# Patient Record
Sex: Female | Born: 1980 | Hispanic: Yes | Marital: Married | Smoking: Never smoker
Health system: Southern US, Community
[De-identification: ages and names within clinical notes are randomized; demographics above are authoritative.]

## PROBLEM LIST (undated history)

## (undated) DIAGNOSIS — O99210 Obesity complicating pregnancy, unspecified trimester: Secondary | ICD-10-CM

## (undated) DIAGNOSIS — F419 Anxiety disorder, unspecified: Secondary | ICD-10-CM

## (undated) DIAGNOSIS — K219 Gastro-esophageal reflux disease without esophagitis: Secondary | ICD-10-CM

## (undated) HISTORY — PX: LAPAROSCOPIC GASTRIC SLEEVE RESECTION: SHX5895

## (undated) HISTORY — PX: APPENDECTOMY: SHX54

## (undated) HISTORY — PX: OTHER SURGICAL HISTORY: SHX169

---

## 2014-06-28 DIAGNOSIS — O99210 Obesity complicating pregnancy, unspecified trimester: Secondary | ICD-10-CM

## 2014-06-28 HISTORY — DX: Obesity complicating pregnancy, unspecified trimester: O99.210

## 2014-10-02 LAB — OB RESULTS CONSOLE GC/CHLAMYDIA
CHLAMYDIA, DNA PROBE: NEGATIVE
Gonorrhea: NEGATIVE

## 2014-10-11 LAB — OB RESULTS CONSOLE VARICELLA ZOSTER ANTIBODY, IGG: VARICELLA IGG: IMMUNE

## 2014-10-11 LAB — OB RESULTS CONSOLE HEPATITIS B SURFACE ANTIGEN: Hepatitis B Surface Ag: NEGATIVE

## 2014-10-11 LAB — OB RESULTS CONSOLE ABO/RH: RH Type: POSITIVE

## 2014-10-11 LAB — OB RESULTS CONSOLE HIV ANTIBODY (ROUTINE TESTING): HIV: NONREACTIVE

## 2014-10-11 LAB — OB RESULTS CONSOLE RUBELLA ANTIBODY, IGM: RUBELLA: IMMUNE

## 2014-11-12 ENCOUNTER — Other Ambulatory Visit: Payer: Self-pay | Admitting: Obstetrics and Gynecology

## 2014-11-12 DIAGNOSIS — O26879 Cervical shortening, unspecified trimester: Secondary | ICD-10-CM

## 2014-11-21 ENCOUNTER — Ambulatory Visit
Admission: RE | Admit: 2014-11-21 | Discharge: 2014-11-21 | Disposition: A | Discharge status: Home / Self Care | Payer: BLUE CROSS/BLUE SHIELD | Source: Ambulatory Visit | Attending: Maternal & Fetal Medicine | Admitting: Maternal & Fetal Medicine

## 2014-11-21 DIAGNOSIS — O26872 Cervical shortening, second trimester: Secondary | ICD-10-CM | POA: Insufficient documentation

## 2014-11-21 DIAGNOSIS — O26879 Cervical shortening, unspecified trimester: Secondary | ICD-10-CM

## 2014-11-21 LAB — US OB TRANSVAGINAL

## 2015-02-04 LAB — OB RESULTS CONSOLE RPR: RPR: NONREACTIVE

## 2015-03-17 ENCOUNTER — Other Ambulatory Visit: Payer: Self-pay | Admitting: Obstetrics and Gynecology

## 2015-03-17 DIAGNOSIS — O35EXX0 Maternal care for other (suspected) fetal abnormality and damage, fetal genitourinary anomalies, not applicable or unspecified: Secondary | ICD-10-CM

## 2015-03-17 DIAGNOSIS — O358XX0 Maternal care for other (suspected) fetal abnormality and damage, not applicable or unspecified: Secondary | ICD-10-CM

## 2015-03-27 ENCOUNTER — Ambulatory Visit
Admission: RE | Admit: 2015-03-27 | Discharge: 2015-03-27 | Disposition: A | Discharge status: Home / Self Care | Payer: BLUE CROSS/BLUE SHIELD | Source: Ambulatory Visit | Attending: Obstetrics and Gynecology | Admitting: Obstetrics and Gynecology

## 2015-03-27 ENCOUNTER — Ambulatory Visit (HOSPITAL_BASED_OUTPATIENT_CLINIC_OR_DEPARTMENT_OTHER)
Admission: RE | Admit: 2015-03-27 | Discharge: 2015-03-27 | Disposition: A | Discharge status: Home / Self Care | Payer: BLUE CROSS/BLUE SHIELD | Source: Ambulatory Visit | Attending: Obstetrics and Gynecology | Admitting: Obstetrics and Gynecology

## 2015-03-27 VITALS — BP 111/72 | HR 91 | Temp 98.3°F | Resp 18 | Ht 63.6 in | Wt 153.2 lb

## 2015-03-27 DIAGNOSIS — O358XX0 Maternal care for other (suspected) fetal abnormality and damage, not applicable or unspecified: Secondary | ICD-10-CM

## 2015-03-27 DIAGNOSIS — IMO0001 Reserved for inherently not codable concepts without codable children: Secondary | ICD-10-CM

## 2015-03-27 DIAGNOSIS — IMO0002 Reserved for concepts with insufficient information to code with codable children: Secondary | ICD-10-CM | POA: Insufficient documentation

## 2015-03-27 DIAGNOSIS — O35EXX0 Maternal care for other (suspected) fetal abnormality and damage, fetal genitourinary anomalies, not applicable or unspecified: Secondary | ICD-10-CM

## 2015-03-27 HISTORY — DX: Obesity complicating pregnancy, unspecified trimester: O99.210

## 2015-03-27 NOTE — Progress Notes (Addendum)
[facility] Consultation   Chief Complaint: renal anomaly on ultrasound   HPI: [name] is a 34 y.o. G2P1001 at 35 weeks sent by [facility] for fetal renal dilation  Past Medical History: Patient  has a past medical history of Obesity affecting pregnancy (2016).  Past Surgical History: She  has past surgical history that includes Cesarean section.  Obstetric History:  OB History    Gravida Para Term Preterm AB TAB SAB Ectopic Multiple Living   Gynecologic History:  Patient's last menstrual period was 07/15/2014.    Allergies: Patient is allergic to latex.  Social History: Patient  reports that she has never smoked. She has never used smokeless tobacco. She reports that she does not drink alcohol or use illicit drugs.  Family History: family history is not on file.  Review of Systems A full 12 point review of systems was negative or as noted in the History of Present Illness.  Physical Exam: BP 111/72 mmHg  Pulse 91  Temp(Src) 98.3 F (36.8 C) (Oral)  Resp 18  Ht 5' 3.6" (1.615 m)  Wt 153 lb 3.2 oz (69.491 kg)  BMI 26.64 kg/m2  SpO2 98%  LMP 07/15/2014 See u/s report   Asessement: 1. Fetal renal anomaly, not applicable or unspecified fetus   duplication of left renal collecting system , minimal dilation   Plan: Neonatal evaluation of kidneys  - pt and husband were reassured that overall health of the fetus appears good . They should share the information regarding the kidney with the pediatrician.    Total time spent with the patient was 15  minutes with greater than 50% spent in counseling and coordination of care. We appreciate this interesting consult and will be happy to be involved in the ongoing care of [name] in anyway her obstetricians desire.  Jimmey Ralph MD  Maternal-Fetal Medicine [facility]

## 2015-04-07 LAB — OB RESULTS CONSOLE GBS: GBS: NEGATIVE

## 2015-04-23 ENCOUNTER — Encounter
Admission: RE | Admit: 2015-04-23 | Discharge: 2015-04-23 | Disposition: A | Discharge status: Home / Self Care | Payer: BLUE CROSS/BLUE SHIELD | Source: Ambulatory Visit | Attending: Obstetrics and Gynecology | Admitting: Obstetrics and Gynecology

## 2015-04-23 VITALS — BP 119/76 | HR 72 | Ht 62.6 in | Wt 222.0 lb

## 2015-04-23 DIAGNOSIS — O358XX0 Maternal care for other (suspected) fetal abnormality and damage, not applicable or unspecified: Principal | ICD-10-CM

## 2015-04-23 DIAGNOSIS — IMO0001 Reserved for inherently not codable concepts without codable children: Secondary | ICD-10-CM

## 2015-04-23 HISTORY — DX: Gastro-esophageal reflux disease without esophagitis: K21.9

## 2015-04-23 HISTORY — DX: Anxiety disorder, unspecified: F41.9

## 2015-04-23 LAB — CBC
HCT: 42 % (ref 35.0–47.0)
Hemoglobin: 13.9 g/dL (ref 12.0–16.0)
MCH: 29.5 pg (ref 26.0–34.0)
MCHC: 32.9 g/dL (ref 32.0–36.0)
MCV: 89.5 fL (ref 80.0–100.0)
PLATELETS: 225 10*3/uL (ref 150–440)
RBC: 4.7 MIL/uL (ref 3.80–5.20)
RDW: 15.2 % — AB (ref 11.5–14.5)
WBC: 8.9 10*3/uL (ref 3.6–11.0)

## 2015-04-23 LAB — TYPE AND SCREEN
ABO/RH(D): AB POS
Antibody Screen: NEGATIVE
Extend sample reason: UNDETERMINED

## 2015-04-23 LAB — DIFFERENTIAL
Basophils Absolute: 0 10*3/uL (ref 0–0.1)
Basophils Relative: 0 %
Eosinophils Absolute: 0 10*3/uL (ref 0–0.7)
Eosinophils Relative: 0 %
LYMPHS PCT: 26 %
Lymphs Abs: 2.3 10*3/uL (ref 1.0–3.6)
MONO ABS: 0.7 10*3/uL (ref 0.2–0.9)
MONOS PCT: 8 %
NEUTROS ABS: 5.9 10*3/uL (ref 1.4–6.5)
Neutrophils Relative %: 66 %

## 2015-04-23 LAB — ABO/RH: ABO/RH(D): AB POS

## 2015-04-23 LAB — RAPID HIV SCREEN (HIV 1/2 AB+AG)
HIV 1/2 ANTIBODIES: NONREACTIVE
HIV-1 P24 ANTIGEN - HIV24: NONREACTIVE

## 2015-04-24 ENCOUNTER — Inpatient Hospital Stay: Payer: BLUE CROSS/BLUE SHIELD | Admitting: Anesthesiology

## 2015-04-24 ENCOUNTER — Inpatient Hospital Stay
Admission: RE | Admit: 2015-04-24 | Discharge: 2015-04-26 | DRG: 766 | Disposition: A | Discharge status: Home / Self Care | Payer: BLUE CROSS/BLUE SHIELD | Source: Ambulatory Visit | Attending: Obstetrics and Gynecology | Admitting: Obstetrics and Gynecology

## 2015-04-24 ENCOUNTER — Encounter
Admission: RE | Disposition: A | Discharge status: Home / Self Care | Payer: Self-pay | Source: Ambulatory Visit | Attending: Obstetrics and Gynecology

## 2015-04-24 DIAGNOSIS — Z302 Encounter for sterilization: Secondary | ICD-10-CM | POA: Diagnosis not present

## 2015-04-24 DIAGNOSIS — Z3A40 40 weeks gestation of pregnancy: Secondary | ICD-10-CM | POA: Diagnosis not present

## 2015-04-24 DIAGNOSIS — Z98891 History of uterine scar from previous surgery: Secondary | ICD-10-CM

## 2015-04-24 DIAGNOSIS — IMO0001 Reserved for inherently not codable concepts without codable children: Secondary | ICD-10-CM

## 2015-04-24 DIAGNOSIS — O34211 Maternal care for low transverse scar from previous cesarean delivery: Secondary | ICD-10-CM | POA: Diagnosis present

## 2015-04-24 DIAGNOSIS — E669 Obesity, unspecified: Secondary | ICD-10-CM

## 2015-04-24 DIAGNOSIS — O480 Post-term pregnancy: Principal | ICD-10-CM | POA: Diagnosis present

## 2015-04-24 DIAGNOSIS — N979 Female infertility, unspecified: Secondary | ICD-10-CM

## 2015-04-24 DIAGNOSIS — O358XX0 Maternal care for other (suspected) fetal abnormality and damage, not applicable or unspecified: Secondary | ICD-10-CM

## 2015-04-24 DIAGNOSIS — Z6830 Body mass index (BMI) 30.0-30.9, adult: Secondary | ICD-10-CM

## 2015-04-24 DIAGNOSIS — O0990 Supervision of high risk pregnancy, unspecified, unspecified trimester: Secondary | ICD-10-CM

## 2015-04-24 LAB — OB RESULTS CONSOLE GC/CHLAMYDIA
Chlamydia: NEGATIVE
Gonorrhea: NEGATIVE

## 2015-04-24 LAB — OB RESULTS CONSOLE HIV ANTIBODY (ROUTINE TESTING): HIV: NONREACTIVE

## 2015-04-24 LAB — RPR: RPR Ser Ql: NONREACTIVE

## 2015-04-24 LAB — OB RESULTS CONSOLE RPR: RPR: NONREACTIVE

## 2015-04-24 LAB — OB RESULTS CONSOLE GBS: GBS: NEGATIVE

## 2015-04-24 SURGERY — Surgical Case
Anesthesia: Spinal

## 2015-04-24 MED ORDER — PRENATAL MULTIVITAMIN CH
1.0000 | ORAL_TABLET | Freq: Every day | ORAL | Status: DC
  Administered 2015-04-25 – 2015-04-26 (×2): 1 via ORAL
  Filled 2015-04-24 (×2): qty 1

## 2015-04-24 MED ORDER — ONDANSETRON HCL 4 MG/2ML IJ SOLN
INTRAMUSCULAR | Status: DC | PRN
  Administered 2015-04-24: 4 mg via INTRAVENOUS

## 2015-04-24 MED ORDER — DIPHENHYDRAMINE HCL 50 MG/ML IJ SOLN
12.5000 mg | INTRAMUSCULAR | Status: DC | PRN
Start: 2015-04-24 — End: 2015-04-25

## 2015-04-24 MED ORDER — DIBUCAINE 1 % RE OINT: 1.0000 "application " | TOPICAL_OINTMENT | RECTAL | Status: DC | PRN

## 2015-04-24 MED ORDER — SODIUM CHLORIDE 0.9 % IJ SOLN
INTRAMUSCULAR | Status: AC
  Filled 2015-04-24: qty 10

## 2015-04-24 MED ORDER — IBUPROFEN 600 MG PO TABS
600.0000 mg | ORAL_TABLET | Freq: Four times a day (QID) | ORAL | Status: DC | PRN
  Administered 2015-04-25 – 2015-04-26 (×5): 600 mg via ORAL
  Filled 2015-04-24 (×5): qty 1

## 2015-04-24 MED ORDER — NALBUPHINE HCL 10 MG/ML IJ SOLN: 5.0000 mg | Freq: Once | INTRAMUSCULAR | Status: DC | PRN

## 2015-04-24 MED ORDER — OXYTOCIN 40 UNITS IN LACTATED RINGERS INFUSION - SIMPLE MED
INTRAVENOUS | Status: DC | PRN
  Administered 2015-04-24: 4 mL via INTRAVENOUS

## 2015-04-24 MED ORDER — LACTATED RINGERS IV SOLN
INTRAVENOUS | Status: DC
  Administered 2015-04-24 – 2015-04-25 (×3): via INTRAVENOUS

## 2015-04-24 MED ORDER — CEFAZOLIN SODIUM-DEXTROSE 2-3 GM-% IV SOLR
2.0000 g | INTRAVENOUS | Status: AC
  Administered 2015-04-24: 2 g via INTRAVENOUS
  Filled 2015-04-24: qty 50

## 2015-04-24 MED ORDER — MEPERIDINE HCL 25 MG/ML IJ SOLN: 6.2500 mg | INTRAMUSCULAR | Status: DC | PRN

## 2015-04-24 MED ORDER — SODIUM CHLORIDE 0.9 % IJ SOLN: 3.0000 mL | INTRAMUSCULAR | Status: DC | PRN

## 2015-04-24 MED ORDER — OXYTOCIN 40 UNITS IN LACTATED RINGERS INFUSION - SIMPLE MED: 62.5000 mL/h | INTRAVENOUS | Status: AC

## 2015-04-24 MED ORDER — BUPIVACAINE HCL (PF) 0.5 % IJ SOLN: 5.0000 mL | Freq: Once | INTRAMUSCULAR | Status: DC

## 2015-04-24 MED ORDER — MENTHOL 3 MG MT LOZG: 1.0000 | LOZENGE | OROMUCOSAL | Status: DC | PRN

## 2015-04-24 MED ORDER — BUPIVACAINE 0.25 % ON-Q PUMP DUAL CATH 400 ML: INJECTION | Status: DC

## 2015-04-24 MED ORDER — ONDANSETRON HCL 4 MG/2ML IJ SOLN: 4.0000 mg | Freq: Three times a day (TID) | INTRAMUSCULAR | Status: DC | PRN

## 2015-04-24 MED ORDER — WITCH HAZEL-GLYCERIN EX PADS: 1.0000 "application " | MEDICATED_PAD | CUTANEOUS | Status: DC | PRN

## 2015-04-24 MED ORDER — FENTANYL CITRATE (PF) 100 MCG/2ML IJ SOLN: 25.0000 ug | INTRAMUSCULAR | Status: DC | PRN

## 2015-04-24 MED ORDER — SENNOSIDES-DOCUSATE SODIUM 8.6-50 MG PO TABS
2.0000 | ORAL_TABLET | ORAL | Status: DC
  Administered 2015-04-25: 2 via ORAL
  Filled 2015-04-24: qty 2

## 2015-04-24 MED ORDER — DIPHENHYDRAMINE HCL 25 MG PO CAPS: 25.0000 mg | ORAL_CAPSULE | ORAL | Status: DC | PRN

## 2015-04-24 MED ORDER — NALBUPHINE HCL 10 MG/ML IJ SOLN
5.0000 mg | INTRAMUSCULAR | Status: DC | PRN
  Filled 2015-04-24: qty 0.5

## 2015-04-24 MED ORDER — SCOPOLAMINE 1 MG/3DAYS TD PT72: 1.0000 | MEDICATED_PATCH | Freq: Once | TRANSDERMAL | Status: DC

## 2015-04-24 MED ORDER — LACTATED RINGERS IV SOLN: INTRAVENOUS | Status: DC

## 2015-04-24 MED ORDER — LANOLIN HYDROUS EX OINT: 1.0000 "application " | TOPICAL_OINTMENT | CUTANEOUS | Status: DC | PRN

## 2015-04-24 MED ORDER — BUPIVACAINE HCL (PF) 0.5 % IJ SOLN
5.0000 mL | Freq: Once | INTRAMUSCULAR | Status: DC
  Filled 2015-04-24: qty 30

## 2015-04-24 MED ORDER — FERROUS SULFATE 325 (65 FE) MG PO TABS
325.0000 mg | ORAL_TABLET | Freq: Two times a day (BID) | ORAL | Status: DC
  Administered 2015-04-25: 325 mg via ORAL
  Filled 2015-04-24: qty 1

## 2015-04-24 MED ORDER — SIMETHICONE 80 MG PO CHEW
80.0000 mg | CHEWABLE_TABLET | Freq: Three times a day (TID) | ORAL | Status: DC
  Administered 2015-04-25 – 2015-04-26 (×5): 80 mg via ORAL
  Filled 2015-04-24 (×5): qty 1

## 2015-04-24 MED ORDER — ONDANSETRON HCL 4 MG/2ML IJ SOLN: 4.0000 mg | Freq: Once | INTRAMUSCULAR | Status: DC | PRN

## 2015-04-24 MED ORDER — NALOXONE HCL 0.4 MG/ML IJ SOLN: 0.4000 mg | INTRAMUSCULAR | Status: DC | PRN

## 2015-04-24 MED ORDER — NALBUPHINE HCL 10 MG/ML IJ SOLN: 5.0000 mg | INTRAMUSCULAR | Status: DC | PRN

## 2015-04-24 MED ORDER — BUPIVACAINE 0.25 % ON-Q PUMP DUAL CATH 400 ML
400.0000 mL | INJECTION | Status: DC
  Filled 2015-04-24: qty 400

## 2015-04-24 MED ORDER — BUPIVACAINE HCL (PF) 0.5 % IJ SOLN
INTRAMUSCULAR | Status: DC | PRN
Start: 2015-04-24 — End: 2015-04-24
  Administered 2015-04-24: 10 mL

## 2015-04-24 MED ORDER — BUPIVACAINE HCL (PF) 0.75 % IJ SOLN
INTRAMUSCULAR | Status: DC | PRN
  Administered 2015-04-24: 1.7 mL

## 2015-04-24 MED ORDER — NALOXONE HCL 2 MG/2ML IJ SOSY: 1.0000 ug/kg/h | PREFILLED_SYRINGE | INTRAVENOUS | Status: DC | PRN

## 2015-04-24 MED ORDER — MORPHINE SULFATE (PF) 0.5 MG/ML IJ SOLN
INTRAMUSCULAR | Status: DC | PRN
  Administered 2015-04-24: .2 mg via EPIDURAL

## 2015-04-24 MED ORDER — LACTATED RINGERS IV SOLN
INTRAVENOUS | Status: DC
  Administered 2015-04-24 (×2): via INTRAVENOUS

## 2015-04-24 MED ORDER — PHENYLEPHRINE HCL 10 MG/ML IJ SOLN
INTRAMUSCULAR | Status: DC | PRN
  Administered 2015-04-24: 100 ug via INTRAVENOUS
  Administered 2015-04-24: 50 ug via INTRAVENOUS

## 2015-04-24 MED ORDER — CITRIC ACID-SODIUM CITRATE 334-500 MG/5ML PO SOLN
30.0000 mL | ORAL | Status: AC
  Administered 2015-04-24: 30 mL via ORAL
  Filled 2015-04-24: qty 30

## 2015-04-24 MED ORDER — DIPHENHYDRAMINE HCL 25 MG PO CAPS: 25.0000 mg | ORAL_CAPSULE | Freq: Four times a day (QID) | ORAL | Status: DC | PRN

## 2015-04-24 SURGICAL SUPPLY — 29 items
CANISTER SUCT 3000ML (MISCELLANEOUS) ×3 IMPLANT
CATH KIT ON-Q SILVERSOAK 5IN (CATHETERS) ×6 IMPLANT
CHLORAPREP W/TINT 26ML (MISCELLANEOUS) ×6 IMPLANT
CLOSURE WOUND 1/2 X4 (GAUZE/BANDAGES/DRESSINGS) ×1
CUP MEDICINE 2OZ PLAST GRAD ST (MISCELLANEOUS) ×3 IMPLANT
DRSG TELFA 3X8 NADH (GAUZE/BANDAGES/DRESSINGS) ×3 IMPLANT
GAUZE SPONGE 4X4 12PLY STRL (GAUZE/BANDAGES/DRESSINGS) ×3 IMPLANT
GLOVE BIO SURGEON STRL SZ8 (GLOVE) ×18 IMPLANT
GLOVE BIOGEL PI IND STRL 7.5 (GLOVE) ×6 IMPLANT
GLOVE BIOGEL PI INDICATOR 7.5 (GLOVE) ×12
GLOVE SKINSENSE NS SZ7.0 (GLOVE) ×2
GLOVE SKINSENSE STRL SZ7.0 (GLOVE) ×1 IMPLANT
GOWN STRL REUS W/ TWL LRG LVL3 (GOWN DISPOSABLE) ×2 IMPLANT
GOWN STRL REUS W/TWL LRG LVL3 (GOWN DISPOSABLE) ×4
LIQUID BAND (GAUZE/BANDAGES/DRESSINGS) ×3 IMPLANT
NDL SAFETY 22GX1.5 (NEEDLE) ×3 IMPLANT
NS IRRIG 1000ML POUR BTL (IV SOLUTION) ×3 IMPLANT
PACK C SECTION AR (MISCELLANEOUS) ×3 IMPLANT
PAD GROUND ADULT SPLIT (MISCELLANEOUS) ×3 IMPLANT
PAD OB MATERNITY 4.3X12.25 (PERSONAL CARE ITEMS) ×6 IMPLANT
PAD PREP 24X41 OB/GYN DISP (PERSONAL CARE ITEMS) ×3 IMPLANT
STRIP CLOSURE SKIN 1/2X4 (GAUZE/BANDAGES/DRESSINGS) ×2 IMPLANT
SUT 2-0 PL GUT LIGAPAK (SUTURE) ×3 IMPLANT
SUT MAXON ABS #0 GS21 30IN (SUTURE) ×6 IMPLANT
SUT MNCRL AB 4-0 PS2 18 (SUTURE) ×3 IMPLANT
SUT VIC AB 0 CT1 36 (SUTURE) ×12 IMPLANT
SUT VIC AB 1 CT1 36 (SUTURE) IMPLANT
SWABSTK COMLB BENZOIN TINCTURE (MISCELLANEOUS) ×3 IMPLANT
SYRINGE 10CC LL (SYRINGE) ×3 IMPLANT

## 2015-04-24 NOTE — Progress Notes (Signed)
Pt arrived to PACU post c/s, assessed, temp at 96.1 axillary. MD and anesthesia notified.  Bear hugger applied and explained to pt with interpreter. Warm fluid in IV.

## 2015-04-24 NOTE — Transfer of Care (Signed)
Immediate Anesthesia Transfer of Care Note  Patient: Elizabeth Mcdowell  Procedure(s) Performed: Procedure(s): CESAREAN SECTION WITH BILATERAL TUBAL LIGATION (N/A)  Patient Location: PACU and Mother/Baby  Anesthesia Type:Spinal  Level of Consciousness: awake, alert  and oriented  Airway & Oxygen Therapy: Patient Spontanous Breathing  Post-op Assessment: Report given to RN and Post -op Vital signs reviewed and stable  Post vital signs: Reviewed and stable  Last Vitals:  Filed Vitals:   04/24/15 0941  BP: 112/70  Pulse: 60  Temp: 35.6 C  Resp: 22    Complications: No apparent anesthesia complications

## 2015-04-24 NOTE — Op Note (Signed)
Cesarean Section Procedure Note   Elizabeth Mcdowell   04/24/2015   Pre-operative Diagnosis: 1) history of prior cesarean delivery, desires repeat, 2) intrauterine gestation at 3739 weeks, 3) desires permanent sterility   Post-operative Diagnosis: 1) history of prior cesarean delivery, desires repeat, 2) intrauterine gestation at 5539 weeks, 3) desires permanent sterility  Procedure:  1) Repeat low-transverse cesarean section 2) bilateral tubal ligation  Surgeon: Surgeon(s) and Role:    * Conard NovakStephen D Jakiah Bienaime, MD - Primary    * Elenora Fenderhelsea C Ward, MD - Assisting   Anesthesia: spinal   Findings:  1) normal appearing gravid uterus, fallopian tubes, and ovaries 2) viable female infant   Estimated Blood Loss: 900 mL  Total IV Fluids: 1,500 ml   Urine Output: 150 mL clear urine at end of case  Specimens: portion of right and left fallopian tube for permanent  Complications: no complications  Disposition: PACU - hemodynamically stable.   Maternal Condition: stable   Baby condition / location:  Couplet care / Skin to Skin  Procedure Details:  The patient was seen in the Holding Room. The risks, benefits, complications, treatment options, and expected outcomes were discussed with the patient. The patient concurred with the proposed plan, giving informed consent. identified as Elizabeth Mcdowell and the procedure verified as C-Section Delivery. A Time Out was held and the above information confirmed.   After induction of anesthesia, the patient was draped and prepped in the usual sterile manner. A Pfannenstiel incision was made and carried down through the subcutaneous tissue to the fascia. Fascial incision was made and extended transversely. The fascia was separated from the underlying rectus tissue superiorly and inferiorly. The peritoneum was identified and entered. Peritoneal incision was extended longitudinally. There was a 3cm segment of omentum adherent to the anterior abdominal  wall that was doubly clamped with Kelly clamps and transected.  The free ends were suture ligated with 0 vicryl. The bladder flap was bluntly freed from the lower uterine segment. A low transverse uterine incision was made and the hysterotomy was extended with cranial-caudal tension. Delivered from cephalic presentation was a 2,890 gram Female with Apgar scores of 9 at one minute and 9 at five minutes. Cord ph was not sent the umbilical cord was clamped and cut cord blood was not obtained for evaluation. The placenta was removed Intact and appeared normal. The uterine outline, tubes and ovaries appeared normal}. The uterine incision was closed with running locked sutures of 0 Vicryl.  A second layer of the same suture was thrown in an imbricating fashion.  Hemostasis was assured.    The tubal ligation portion of the procedure was performed at this point.  The left fallopian tube was identified and followed out to the fimbriated end.  A Babcock clamp was used to grasp the tube in the mid-isthmic portion and two 2-0 plain gut sutures were used to ligate the tube.  An approximately 3cm segment of tube was removed with hemostasis assured.  The same procedure was performed on the right fallopian tube with hemostasis noted. (note the Pomeroy method was used on the left side and due to tube length the Parkland method was performed on the right side).  The uterus was retained to the abdomen and the paracolic gutters were cleared of all clots and debris.  The peritoneum and rectus muscles were reapproximated using 0-Vicryl using a horizontal mattress stitch.  The rectus muscles were inspected and found to be hemostatic.  The On-Q catheter pumps were  inserted in accordance with the manufacturer's recommendations.  The catheters were inserted approximately 4cm cephelad to the incision line, approximately 1cm apart, straddling the midline.  They were inserted to a depth of the 4th mark. They were positioned superficial to  the rectus abdominus muscles and deep to the rectus fascia.    The fascia was then reapproximated with running sutures of 1-0 PDS, looped. After lavage and assurance of hemostasis, the subcutaneous tissue was reapproximated using 2-0 plain gut such that no greater than 2cm of dead space remained. The subcuticular closure was performed using 4-0 monocryl. The skin closure was reinforced using surgical skin glue.   The On-Q catheters were bolused with 5 mL of 0.5% marcaine plain for a total of 10 mL.  The catheters were affixed to the skin with surgical skin glue, steri-strips, and tegaderm.    Instrument, sponge, and needle counts were correct prior the abdominal closure and were correct at the conclusion of the case.  The patient received Ancef 2 gram IV prior to skin incision (within 30 minutes). For VTE prophylaxis she was wearing SCDs throughout the case.   Signed: Conard Novak, MD 04/24/2015 9:30 AM

## 2015-04-24 NOTE — Anesthesia Preprocedure Evaluation (Signed)
Anesthesia Evaluation  Patient identified by MRN, date of birth, ID band Patient awake    Reviewed: Allergy & Precautions, NPO status , Patient's Chart, lab work & pertinent test results  Airway Mallampati: II       Dental no notable dental hx.    Pulmonary neg pulmonary ROS,    Pulmonary exam normal        Cardiovascular negative cardio ROS Normal cardiovascular exam     Neuro/Psych Anxiety negative neurological ROS     GI/Hepatic Neg liver ROS, GERD  ,  Endo/Other  negative endocrine ROSMorbid obesity  Renal/GU negative Renal ROS     Musculoskeletal negative musculoskeletal ROS (+)   Abdominal (+) + obese,   Peds negative pediatric ROS (+)  Hematology negative hematology ROS (+)   Anesthesia Other Findings   Reproductive/Obstetrics negative OB ROS                             Anesthesia Physical Anesthesia Plan  ASA: II  Anesthesia Plan: Spinal   Post-op Pain Management:    Induction:   Airway Management Planned: Simple Face Mask  Additional Equipment:   Intra-op Plan:   Post-operative Plan:   Informed Consent: I have reviewed the patients History and Physical, chart, labs and discussed the procedure including the risks, benefits and alternatives for the proposed anesthesia with the patient or authorized representative who has indicated his/her understanding and acceptance.     Plan Discussed with: CRNA  Anesthesia Plan Comments:         Anesthesia Quick Evaluation

## 2015-04-24 NOTE — Lactation Note (Signed)
This note was copied from the chart of Elizabeth Mcdowell. Lactation Consultation Note  Patient Name: Elizabeth Mcdowell [id number]'s Date: 04/24/2015 Reason for consult: Initial assessment  A medical interpreter was present to translate breast feeding basics and to interpret questions from parents.  Maternal Data Has patient been taught Hand Expression?: Yes Does the patient have breastfeeding experience prior to this delivery?: Yes  Feeding Feeding Type: Breast Fed Length of feed: 20 min  LATCH Score/Interventions Latch: Repeated attempts needed to sustain latch, nipple held in mouth throughout feeding, stimulation needed to elicit sucking reflex.  Audible Swallowing: A few with stimulation Intervention(s): Hand expression;Skin to skin  Type of Nipple: Everted at rest and after stimulation  Comfort (Breast/Nipple): Soft / non-tender     Hold (Positioning): No assistance needed to correctly position infant at breast.  LATCH Score: 8  Lactation Tools Discussed/Used  none   Consult Status   Continue while in patient   Trudee GripCarolyn P Akshaj Besancon 04/24/2015, 3:29 PM

## 2015-04-24 NOTE — H&P (Signed)
History and Physical Interval Note:  Elizabeth Mcdowell  has presented today for surgery, with the diagnosis of PRIOR CSECTION  The various methods of treatment have been discussed with the patient and family. After consideration of risks, benefits and other options for treatment, the patient has consented to  Procedure(s): CESAREAN SECTION WITH BILATERAL TUBAL LIGATION (N/A) as a surgical intervention .  The patient's history has been reviewed, patient examined, no change in status, stable for surgery.  I have reviewed the patient's chart and labs.  Questions were answered to the patient's satisfaction.    The patient does not take a beta blocker and one is not indicated for this surgery.  Conard NovakJackson, Kennedy Bohanon D, MD 04/24/2015 7:30 AM

## 2015-04-24 NOTE — Anesthesia Procedure Notes (Signed)
Spinal Patient location during procedure: OR Start time: 04/24/2015 7:31 AM End time: 04/24/2015 7:48 AM Reason for block: at surgeon's request Staffing Anesthesiologist: Elijio MilesVAN STAVEREN, GIJSBERTUS F Performed by: anesthesiologist  Preanesthetic Checklist Completed: patient identified, site marked, surgical consent, pre-op evaluation, timeout performed, IV checked, risks and benefits discussed, monitors and equipment checked and at surgeon's request Spinal Block Patient position: sitting Prep: Betadine Patient monitoring: heart rate and blood pressure Approach: midline Location: L3-4 Injection technique: single-shot Needle Needle type: Quincke  Needle gauge: 25 G Needle length: 9 cm Needle insertion depth: 6 cm Assessment Sensory level: T6

## 2015-04-24 NOTE — Lactation Note (Signed)
This note was copied from the chart of Elizabeth Mcdowell. Lactation Consultation Note  Patient Name: Elizabeth Mcdowell [id number]'s Date: 04/24/2015 Reason for consult: Follow-up assessment   Maternal Data  BAsic breast feeding reviewed with aid of medical interpreter.  Feeding Feeding Type: Breast Fed  LATCH Score/Interventions Latch: Too sleepy or reluctant, no latch achieved, no sucking elicited. Intervention(s): Adjust position;Assist with latch  Audible Swallowing: None Intervention(s): Skin to skin;Hand expression Intervention(s): Hand expression  Type of Nipple: Everted at rest and after stimulation  Comfort (Breast/Nipple): Soft / non-tender     Hold (Positioning): Assistance needed to correctly position infant at breast and maintain latch.  LATCH Score: 5  Lactation Tools Discussed/Used     Consult Status      Trudee GripCarolyn P Anaja Monts 04/24/2015, 4:56 PM

## 2015-04-25 LAB — CBC
HCT: 39.5 % (ref 35.0–47.0)
Hemoglobin: 12.9 g/dL (ref 12.0–16.0)
MCH: 29.5 pg (ref 26.0–34.0)
MCHC: 32.7 g/dL (ref 32.0–36.0)
MCV: 90.3 fL (ref 80.0–100.0)
PLATELETS: 185 10*3/uL (ref 150–440)
RBC: 4.37 MIL/uL (ref 3.80–5.20)
RDW: 15.4 % — AB (ref 11.5–14.5)
WBC: 10.1 10*3/uL (ref 3.6–11.0)

## 2015-04-25 LAB — SURGICAL PATHOLOGY

## 2015-04-25 MED ORDER — FERROUS SULFATE 325 (65 FE) MG PO TABS
325.0000 mg | ORAL_TABLET | Freq: Every day | ORAL | Status: DC
Start: 2015-04-26 — End: 2015-04-26
  Administered 2015-04-26: 325 mg via ORAL
  Filled 2015-04-25: qty 1

## 2015-04-25 MED ORDER — OXYCODONE HCL 5 MG PO TABS
5.0000 mg | ORAL_TABLET | Freq: Four times a day (QID) | ORAL | Status: DC | PRN
  Administered 2015-04-26: 5 mg via ORAL
  Filled 2015-04-25: qty 1

## 2015-04-25 MED ORDER — DOCUSATE SODIUM 100 MG PO CAPS
100.0000 mg | ORAL_CAPSULE | Freq: Two times a day (BID) | ORAL | Status: DC
  Administered 2015-04-26: 100 mg via ORAL
  Filled 2015-04-25: qty 1

## 2015-04-25 NOTE — Progress Notes (Signed)
Daily Post Partum Note  Elizabeth Mcdowell is a 34 y.o. [id number]  POD#1 s/p rpt c-section and BTL @ 3767w3d.  Pregnancy c/b BMI 30  24hr/overnight events:  none  Subjective:  +flatus, negative ROS. Pt doing well  Objective:    Current Vital Signs 24h Vital Sign Ranges  T 98 F (36.7 C) Temp  Avg: 98.2 F (36.8 C)  Min: 97.8 F (36.6 C)  Max: 98.8 F (37.1 C)  BP 113/61 mmHg BP  Min: 100/68  Max: 138/73  HR 83 Pulse  Avg: 71.7  Min: 56  Max: 92  RR 18 Resp  Avg: 18.8  Min: 16  Max: 23  SaO2 98 % Not Delivered SpO2  Avg: 98.8 %  Min: 98 %  Max: 100 %       24 Hour I/O Current Shift I/O  Time Ins Outs 10/27 0701 - 10/28 0700 In: 7015.7 [P.O.:1680; I.V.:5335.7] Out: 6475 [Urine:5575] 10/28 0701 - 10/28 1900 In: 685 [P.O.:480; I.V.:205] Out: 450 [Urine:450]    General: NAD Abdomen: obese, +BS, soft, NTTP, ND, on q in place and dressing c/d/i Perineum: deferred Skin:  Warm and dry.  Cardiovascular:Regular rate and rhythm. Respiratory:  Clear to auscultation bilateral. Normal respiratory effort Extremities: no c/c/e  Medications Current Facility-Administered Medications  Medication Dose Route Frequency Provider Last Rate Last Dose  . bupivacaine 0.25 % ON-Q pump DUAL CATH 400 mL   Other Continuous Conard NovakStephen D Jackson, MD      . witch hazel-glycerin (TUCKS) pad 1 application  1 application Topical PRN Conard NovakStephen D Jackson, MD       And  . dibucaine (NUPERCAINAL) 1 % rectal ointment 1 application  1 application Rectal PRN Conard NovakStephen D Jackson, MD      . diphenhydrAMINE (BENADRYL) capsule 25 mg  25 mg Oral Q6H PRN Conard NovakStephen D Jackson, MD      . diphenhydrAMINE (BENADRYL) injection 12.5 mg  12.5 mg Intravenous Q4H PRN Gijsbertus Georgana CurioF Van Staveren, MD      . fentaNYL (SUBLIMAZE) injection 25 mcg  25 mcg Intravenous Q5 min PRN Gijsbertus Georgana CurioF Van Staveren, MD      . ferrous sulfate tablet 325 mg  325 mg Oral BID WC Conard NovakStephen D Jackson, MD   325 mg at 04/25/15 0930  . ibuprofen (ADVIL,MOTRIN)  tablet 600 mg  600 mg Oral Q6H PRN Gijsbertus Georgana CurioF Van Staveren, MD   600 mg at 04/25/15 0601  . lactated ringers infusion   Intravenous Continuous Conard NovakStephen D Jackson, MD 125 mL/hr at 04/25/15 0615    . lanolin ointment 1 application  1 application Topical PRN Conard NovakStephen D Jackson, MD      . menthol-cetylpyridinium (CEPACOL) lozenge 3 mg  1 lozenge Oral Q2H PRN Conard NovakStephen D Jackson, MD      . meperidine (DEMEROL) injection 6.25 mg  6.25 mg Intravenous Q5 min PRN Gijsbertus F Darleene CleaverVan Staveren, MD      . nalbuphine (NUBAIN) injection 5 mg  5 mg Intravenous Q4H PRN Gijsbertus Georgana CurioF Van Staveren, MD       Or  . nalbuphine (NUBAIN) injection 5 mg  5 mg Subcutaneous Q4H PRN Gijsbertus F Darleene CleaverVan Staveren, MD      . nalbuphine (NUBAIN) injection 5 mg  5 mg Intravenous Once PRN Gijsbertus Georgana CurioF Van Staveren, MD       Or  . nalbuphine (NUBAIN) injection 5 mg  5 mg Subcutaneous Once PRN Gijsbertus F Darleene CleaverVan Staveren, MD      . naloxone [facility]NARCAN) 2 mg  in dextrose 5 % 250 mL infusion  1-4 mcg/kg/hr Intravenous Continuous PRN Gijsbertus F Darleene Cleaver, MD      . naloxone [facility]) injection 0.4 mg  0.4 mg Intravenous PRN Gijsbertus Georgana Curio, MD       And  . sodium chloride 0.9 % injection 3 mL  3 mL Intravenous PRN Gijsbertus F Darleene Cleaver, MD      . ondansetron [facility]) injection 4 mg  4 mg Intravenous Q8H PRN Gijsbertus F Darleene Cleaver, MD      . ondansetron [facility]) injection 4 mg  4 mg Intravenous Once PRN Gijsbertus Georgana Curio, MD      . prenatal multivitamin tablet 1 tablet  1 tablet Oral Q1200 Conard Novak, MD   1 tablet at 04/25/15 0930  . scopolamine (TRANSDERM-SCOP) 1 MG/3DAYS 1.5 mg  1 patch Transdermal Once Gijsbertus F Darleene Cleaver, MD      . senna-docusate (Senokot-S) tablet 2 tablet  2 tablet Oral Q24H Conard Novak, MD   2 tablet at 04/25/15 0114  . simethicone (MYLICON) chewable tablet 80 mg  80 mg Oral TID PC Conard Novak, MD   80 mg at 04/25/15 0930    Labs:   Recent Labs Lab 04/23/15 0950  04/25/15 0500  WBC 8.9 10.1  HGB 13.9 12.9  HCT 42.0 39.5  PLT 225 185   No results for input(s): NA, K, CL, CO2, BUN, CREATININE, LABGLOM, GLUCOSE, CALCIUM in the last 168 hours.  Assessment & Plan:  Pt doing well *Postpartum/postop: routine care *Dispo: POD2-3   Cornelia Copa MD [facility] Pager [phone]

## 2015-04-25 NOTE — Anesthesia Postprocedure Evaluation (Signed)
  Anesthesia Post-op Note  Patient: Elizabeth Mcdowell  Procedure(s) Performed: Procedure(s): CESAREAN SECTION WITH BILATERAL TUBAL LIGATION (N/A)  Anesthesia type:Spinal  Patient location: Floor  Post pain: Pain level controlled  Post assessment: Post-op Vital signs reviewed, Patient's Cardiovascular Status Stable, Respiratory Function Stable, Patent Airway and No signs of Nausea or vomiting  Post vital signs: Reviewed and stable  Last Vitals:  Filed Vitals:   04/25/15 0554  BP: 127/73  Pulse: 87  Temp:   Resp: 20    Level of consciousness: awake, alert  and patient cooperative  Complications: No apparent anesthesia complications

## 2015-04-25 NOTE — Lactation Note (Signed)
This note was copied from the chart of Elizabeth Mcdowell. Lactation Consultation Note  Patient Name: Elizabeth Mcdowell ONGEX'BToday's Date: 04/25/2015 Reason for consult: Follow-up assessment   Maternal Data Does the patient have breastfeeding experience prior to this delivery?: No Mom brought her own nipple shield, baby able to latch without shield, I told her that she would not need a nipple shield, through spanish interpreter, instructed to call insurance to obtain a personal breast pump for use at home.   Feeding Feeding Type: Breast Milk Length of feed: 25 min (both breasts )  LATCH Score/Interventions Latch: Grasps breast easily, tongue down, lips flanged, rhythmical sucking. Intervention(s): Adjust position;Assist with latch;Breast compression  Audible Swallowing: A few with stimulation Intervention(s): Hand expression  Type of Nipple: Everted at rest and after stimulation  Comfort (Breast/Nipple): Soft / non-tender     Hold (Positioning): Assistance needed to correctly position infant at breast and maintain latch. Intervention(s): Breastfeeding basics reviewed;Support Pillows;Position options  LATCH Score: 8  Lactation Tools Discussed/Used Tools:  (pt had her own nipple shield, informed that she does not nee) WIC Program: No   Consult Status      Dyann KiefMarsha D Jennafer Gladue 04/25/2015, 5:08 PM

## 2015-04-25 NOTE — Anesthesia Post-op Follow-up Note (Signed)
  Anesthesia Pain Follow-up Note  Patient: Elizabeth Mcdowell  Day #: 1  Date of Follow-up: 04/25/2015 Time: 7:18 AM  Last Vitals:  Filed Vitals:   04/25/15 0554  BP: 127/73  Pulse: 87  Temp:   Resp: 20    Level of Consciousness: alert  Pain: mild   Side Effects:None  Catheter Site Exam: site not evaluated  Plan: D/C from anesthesia care  Clydene PughBeane, Zayana Salvador D

## 2015-04-26 MED ORDER — OXYCODONE-ACETAMINOPHEN 5-325 MG PO TABS: 1.0000 | ORAL_TABLET | ORAL | Status: AC | PRN

## 2015-04-26 NOTE — Progress Notes (Addendum)
Patient discharged to home in stable condtion. Patient aware to follow up with OB doctor in 1 week. Patient educated on when to return for any change in condition.

## 2015-04-26 NOTE — Discharge Instructions (Signed)
Parto por cesrea - Cuidados posteriores  (Cesarean Delivery, Care After) Siga estas instrucciones durante las prximas semanas. Estas indicaciones le proporcionan informacin general acerca de cmo deber cuidarse despus del procedimiento. El mdico tambin podr darle instrucciones ms especficas. El tratamiento se ha planificado de acuerdo a las prcticas mdicas actuales, pero a veces se producen problemas. Comunquese con el mdico si tiene algn problema o tiene dudas cuando vuelva a su casa.  INSTRUCCIONES PARA EL CUIDADO EN EL HOGAR  Tome slo medicamentos de venta libre o recetados, segn las indicaciones del mdico.  No beba alcohol, especialmente si est amamantando o toma analgsicos.  Nomastique tabaco ni fume.  Contine con un adecuado cuidado perineal. El buen cuidado perineal incluye:  Higienizarse de adelante hacia atrs.  Mantener la zona perineal limpia.  Controlar diariamente el corte (incisin) y observar si aumenta el enrojecimiento, si supura, se hincha o se separa la piel.  Limpie la incisin suavemente con jabn y agua todos los das, y luego squela dando golpecitos. Si el mdico la autoriza, deje la incisin al descubierto. Use un apsito (vendaje) si drena lquido o la incisin parece irritada. Si las pequeas tiras [facility] que cruzan la incisin no se caen dentro de los 7 das, retrelas suavemente.  Abrace una almohada al toser o estornudar hasta que la incisin se cure. Esto ayuda a Best boy.  No conduzca vehculos ni opere maquinarias hasta que el mdico la autorice.  Dchese, lvese el cabello y tome baos de inmersin segn las indicaciones de su mdico.  Utilice un sostn que le ajuste bien y que brinde buen soporte a sus Glass blower/designer.  Limite el uso de bombachas de sostn o medias panty.  Beba suficiente lquido para Consulting civil engineer orina clara o de color amarillo plido.  Consuma todos los das alimentos ricos en fibra como cereales y panes  [place], arroz, frijoles y frutas frescas y verduras. Estos alimentos pueden ayudarla a prevenir o Cytogeneticist.  Reanude las actividades como subir escaleras, conducir automviles, levantar objetos pesados, hacer ejercicios o viajar cuando le indique su mdico.  Hable con su mdico acerca de reanudar la actividad sexual. Volver a la actividad sexual depende del riesgo de infeccin, la velocidad de la curacin y la comodidad y su deseo de Financial controller.  Trate de que alguien la ayude con las actividades del hogar y con el recin nacido al menos durante algunos das despus de salir del hospital.  Descanse todo lo que pueda. Trate de descansar o tomar una siesta mientras el beb est durmiendo.  Aumente sus actividades gradualmente.  Cumpla con todos los controles programados para despus del [place]. Es muy importante asistir a todas las visitas de Nurse, adult. En estas visitas, su mdico va a controlarla para asegurarse de que est sanando fsica y emocionalmente. SOLICITE ATENCIN MDICA SI:   Elimina cogulos grandes por la vagina. Guarde algunos cogulos para mostrarle al mdico.  Tiene una secrecin con feo olor que proviene de la vagina.  Tiene dificultad para orinar.  Orina con frecuencia.  Siente dolor al [employer].  Nota un cambio en sus movimientos intestinales.  Aumenta el enrojecimiento, el dolor o la hinchazn en la zona de la incisin.  Observa que supura pus en la incisin.  La incisin se abre.  Sus [employer] duelen, estn duras o enrojecidas.  Sufre un dolor intenso de [place].  Tiene visin borrosa o ve manchas.  Se siente triste o deprimida.  Tiene pensamientos acerca de lastimarse o daar al  recién nacido. °· Tiene preguntas acerca de su cuidado, la atención del recién nacido o acerca de los medicamentos. °· Se siente mareada o sufre un desmayo. °· Tiene una erupción. °· Siente dolor u observa enrojecimiento o hinchazón en el sitio en que  estaba la vía intravenosa (IV). °· Tiene náuseas o vómitos. °· Usted dejó de amamantar al bebé y no ha tenido su período menstrual dentro de las 12 semanas siguientes. °· No amamanta al bebé y no tuvo su período menstrual en las últimas 12 semanas. °· Tiene fiebre. °SOLICITE ATENCIÓN MÉDICA DE INMEDIATO SI:  °· Siente dolor persistente. °· Siente dolor en el pecho. °· Le falta el aire. °· Se desmaya. °· Siente dolor en la pierna. °· Siente dolor en el estómago. °· El sangrado vaginal satura dos o más apósitos en 1 hora. °ASEGÚRESE DE QUE:  °· Comprende estas instrucciones. °· Controlará su enfermedad. °· Recibirá ayuda de inmediato si no mejora o si empeora. °  °Esta información no tiene como fin reemplazar el consejo del médico. Asegúrese de hacerle al médico cualquier pregunta que tenga. °  °Document Released: 06/14/2005 Document Revised: 07/05/2014 °Elsevier Interactive Patient Education ©2016 Elsevier Inc. ° °

## 2015-04-26 NOTE — Discharge Summary (Signed)
Obstetrical Discharge Summary  Date of Admission: 04/24/2015 Date of Discharge: @dischargedt @  Discharge Diagnosis: Term Pregnancy-delivered Primary OB:  [facility]   Gestational Age at Delivery: [id number]  Antepartum complications: none Date of Delivery: 04/24/15  Delivered By: Jean RosenthalJackson Delivery Type: repeat cesarean section, low transverse incision and BTL Intrapartum complications/course: None Anesthesia: spinal Placenta: manual removal Laceration: n/a Episiotomy: none Live born female  Birth Weight: 6 lb 5.9 oz (2890 g) APGAR: 9, 9   Post partum course: Since the delivery, patient has tolerate activity, diet, and daily functions without difficulty or complication.  Min lochia.  No breast concerns at this time.  No signs of depression currently.   Postpartum Exam:General appearance: alert, cooperative and no distress GI: soft, non-tender; bowel sounds normal; no masses,  no organomegaly Extremities: extremities normal, atraumatic, no cyanosis or edema  Disposition: home with infant Rh Immune globulin given: no Rubella vaccine given: no Varicella vaccine given: no Tdap vaccine given in AP or PP setting: given during prenatal care Flu vaccine given in AP or PP setting: given during prenatal care Contraception: bilateral tubal ligation  Prenatal Labs: AB POS//Rubella Immune//RPR negative//HIV negative/HepB Surface Ag negative//plans to breastfeed  Plan:  Elizabeth Mcdowell was discharged to home in good condition. [facility]  Discharge Medications:   Medication List    TAKE these medications        calcium carbonate 750 MG chewable tablet  Commonly known as:  TUMS EX  Chew 2 tablets by mouth 2 (two) times daily.     multivitamin-prenatal 27-0.8 MG Tabs tablet  Take 1 tablet by mouth daily at 12 noon.     oxyCODONE-acetaminophen 5-325 MG tablet  Commonly known as:  PERCOCET  Take 1 tablet by mouth every 4 (four) hours as needed  for moderate pain or severe pain.     ranitidine 150 MG tablet  Commonly known as:  ZANTAC  Take 150 mg by mouth 2 (two) times daily.        [facility]:  [facility]

## 2015-11-24 ENCOUNTER — Emergency Department
Admission: EM | Admit: 2015-11-24 | Discharge: 2015-11-24 | Disposition: A | Discharge status: Home / Self Care | Payer: BLUE CROSS/BLUE SHIELD | Attending: Emergency Medicine | Admitting: Emergency Medicine

## 2015-11-24 ENCOUNTER — Encounter: Payer: Self-pay | Admitting: Emergency Medicine

## 2015-11-24 DIAGNOSIS — Z91040 Latex allergy status: Secondary | ICD-10-CM | POA: Insufficient documentation

## 2015-11-24 DIAGNOSIS — K5900 Constipation, unspecified: Secondary | ICD-10-CM

## 2015-11-24 DIAGNOSIS — Z79899 Other long term (current) drug therapy: Secondary | ICD-10-CM | POA: Diagnosis not present

## 2015-11-24 MED ORDER — POLYETHYLENE GLYCOL 3350 17 G PO PACK: 17.0000 g | PACK | Freq: Every day | ORAL | Status: AC | PRN

## 2015-11-24 MED ORDER — MAGNESIUM CITRATE PO SOLN
0.5000 | Freq: Once | ORAL | Status: AC
  Administered 2015-11-24: 0.5 via ORAL
  Filled 2015-11-24 (×2): qty 296

## 2015-11-24 MED ORDER — MAGNESIUM CITRATE PO SOLN: 1.0000 | Freq: Once | ORAL | Status: AC | PRN

## 2015-11-24 MED ORDER — DOCUSATE SODIUM 100 MG PO CAPS
100.0000 mg | ORAL_CAPSULE | Freq: Once | ORAL | Status: AC
  Administered 2015-11-24: 100 mg via ORAL
  Filled 2015-11-24: qty 1

## 2015-11-24 NOTE — ED Notes (Signed)
Patient presents to the ED with constipation since Thursday.  Patient states her last normal bowel movement was Thursday.  Patient has taken milk of magnesia that helped a little but patient still feels impacted.  Patient has used an enema recently without result.

## 2015-11-24 NOTE — ED Provider Notes (Signed)
[facility] Emergency Department Provider Note   ____________________________________________  Time seen: Approximately 350 PM  I have reviewed the triage vital signs and the nursing notes.   HISTORY  Chief Complaint Constipation  Interpreter services, Asaias, utilized for translation HPI Elizabeth Mcdowell is a 35 y.o. female with a recent history of a gastric sleeve procedure was presenting to the emergency department with constipation over the past 5 days. She says that this past Friday she had her diet changed from just liquid to shakes as well as some solids. She says ever since then she has been having a very hard time moving her bowels. She says that she actually pulled open her rectum to release some stool but still feels stool in her rectum and is very uncomfortable. She denies any abdominal pain, nausea or vomiting.She said that she also used a Fleet enema which provided some relief and help with moving her bowels.   Past Medical History  Diagnosis Date  . Obesity affecting pregnancy 2016  . Anxiety   . GERD (gastroesophageal reflux disease)     Patient Active Problem List   Diagnosis Date Noted  . Supervision of high-risk pregnancy 04/24/2015  . H/O cesarean section 04/24/2015  . 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 Obesity 04/24/2015  . BMI 30.0-30.9,adult 04/24/2015  . S/P cesarean section 04/24/2015  . Fetal renal anomaly 03/27/2015    Past Surgical History  Procedure Laterality Date  . Cesarean section    . Cyst on ovary    . Appendectomy    . Cesarean section with bilateral tubal ligation N/A 04/24/2015    Procedure: CESAREAN SECTION WITH BILATERAL TUBAL LIGATION;  Surgeon: Conard NovakStephen D Jackson, MD;  Location: [facility] ORS;  Service: Obstetrics;  Laterality: N/A;    Current Outpatient Rx  Name  Route  Sig  Dispense  Refill  . calcium carbonate (TUMS EX) 750 MG chewable tablet   Oral   Chew 2 tablets by mouth 2 (two) times daily.         Marland Kitchen. oxyCODONE-acetaminophen (PERCOCET) 5-325 MG tablet   Oral   Take 1 tablet by mouth every 4 (four) hours as needed for moderate pain or severe pain.   40 tablet   0   . Prenatal Vit-Fe Fumarate-FA (MULTIVITAMIN-PRENATAL) 27-0.8 MG TABS tablet   Oral   Take 1 tablet by mouth daily at 12 noon.         . ranitidine (ZANTAC) 150 MG tablet   Oral   Take 150 mg by mouth 2 (two) times daily.           Allergies Latex  No family history on file.  Social History Social History  Substance Use Topics  . Smoking status: Never Smoker   . Smokeless tobacco: Never Used  . Alcohol Use: No    Review of Systems Constitutional: No fever/chills Eyes: No visual changes. ENT: No sore throat. Cardiovascular: Denies chest pain. Respiratory: Denies shortness of breath. Gastrointestinal: No abdominal pain.  No nausea, no vomiting.  No diarrhea.   Genitourinary: Negative for dysuria. Musculoskeletal: Negative for back pain. Skin: Negative for rash. Neurological: Negative for headaches, focal weakness or numbness.  10-point ROS otherwise negative.  ____________________________________________   PHYSICAL EXAM:  VITAL SIGNS: ED Triage Vitals  Enc Vitals Group     BP 11/24/15 1532 110/75 mmHg     Pulse Rate 11/24/15 1532 71     Resp 11/24/15 1532 18     Temp 11/24/15 1532 98 F (  36.7 C)     Temp Source 11/24/15 1532 Oral     SpO2 11/24/15 1532 100 %     Weight 11/24/15 1532 206 lb 12.8 oz (93.804 kg)     Height 11/24/15 1532  (1.6 m)     Head Cir --      Peak Flow --      Pain Score 11/24/15 1534 6     Pain Loc --      Pain Edu? --      Excl. in GC? --     Constitutional: Alert and oriented. Well appearing and in no acute distress. Eyes: Conjunctivae are normal. PERRL. EOMI. Head: Atraumatic. Nose: No congestion/rhinnorhea. Mouth/Throat: Mucous membranes are moist.   Neck: No stridor.   Cardiovascular: Normal rate, regular rhythm. Grossly normal heart  sounds.   Respiratory: Normal respiratory effort.  No retractions. Lungs CTAB. Gastrointestinal: Soft and nontender. No distention. Rectal exam with hard stool high up in the rectum. Unable to disimpact because of the stool being so high. Brown stool on glove. No blood. Musculoskeletal: No lower extremity tenderness nor edema.  No joint effusions. Neurologic:  Normal speech and language. No gross focal neurologic deficits are appreciated.  Skin:  Skin is warm, dry and intact. No rash noted. Psychiatric: Mood and affect are normal. Speech and behavior are normal.  ____________________________________________   LABS (all labs ordered are listed, but only abnormal results are displayed)  Labs Reviewed - No data to display ____________________________________________  EKG   ____________________________________________  RADIOLOGY   ____________________________________________   PROCEDURES    ____________________________________________   INITIAL IMPRESSION / ASSESSMENT AND PLAN / ED COURSE  Pertinent labs & imaging results that were available during my care of the patient were reviewed by me and considered in my medical decision making (see chart for details).  ----------------------------------------- 5:22 PM on 11/24/2015 -----------------------------------------  Patient passed opaque fluid after enema but did not have any sort of large bowel movement. She has only been taking milk of magnesia at home. We'll increase her bowel movement regimen to include MiraLAX. Also discussed using magnesium citrate as needed. Translator, Asaias, was present again for this. She will also be calling her bariatric surgeon tomorrow for further guidance. Patient's surgery was on May 8. I discussed surgery with our surgeon on call here, Dr. Everlene Farrier, to confirm the safety of laxatives after this procedure. Patient and her standing up appointment in one to comply. Resting comfortably at this time  and says that the only pain is when she is going to move her bowels. She is not showing signs of obstruction at this time. Able to pass stool earlier today without any nausea or vomiting or abdominal distention. ____________________________________________   FINAL CLINICAL IMPRESSION(S) / ED DIAGNOSES  Constipation.    NEW MEDICATIONS STARTED DURING THIS VISIT:  New Prescriptions   No medications on file     Note:  This document was prepared using Dragon voice recognition software and may include unintentional dictation errors.    Myrna Blazer, MD 11/24/15 [phone]

## 2015-11-24 NOTE — ED Notes (Signed)
Soap suds enema with colace and 1/2 bottle mag citrate given. Pt able to hold enema after.

## 2015-11-24 NOTE — ED Notes (Signed)
Last note was written by this RN and entered under tech in error.  Patient also reports recent gastric sleeve surgery but states she hasn't taken the pain medication she was prescribed in 2 weeks.

## 2015-11-24 NOTE — Discharge Instructions (Signed)
Estreimiento - Adultos (Constipation, Adult) Estreimiento significa que una persona tiene menos de tres evacuaciones en una semana, dificultad para defecar, o que las heces son secas, duras, o ms grandes que lo normal. A medida que envejecemos el estreimiento es ms comn. Una dieta baja en fibra, no tomar suficientes lquidos y el uso de ciertos medicamentos pueden empeorar el estreimiento.  CAUSAS   Ciertos medicamentos, como los antidepresivos, analgsicos, suplementos de hierro, anticidos y diurticos.  Algunas enfermedades, como la diabetes, el sndrome del colon irritable, enfermedad de la tiroides, o depresin.  No beber suficiente agua.  No consumir suficientes alimentos ricos en fibra.  Situaciones de estrs o viajes.  Falta de actividad fsica o de ejercicio.  Ignorar la necesidad sbita de defecar.  Uso en exceso de laxantes. SIGNOS Y SNTOMAS   Defecar menos de tres veces por semana.  Dificultad para defecar.  Tener las heces secas y duras, o ms grandes que las normales.  Sensacin de estar lleno o hinchado.  Dolor en la parte baja del abdomen.  No sentir alivio despus de defecar. DIAGNSTICO  El mdico le har una historia clnica y un examen fsico. Pueden hacerle exmenes adicionales para el estreimiento grave. Estos estudios pueden ser:  Un radiografa con enema de bario para examinar el recto, el colon y, en algunos casos, el intestino delgado.  Una sigmoidoscopia para examinar el colon inferior.  Una colonoscopia para examinar todo el colon. TRATAMIENTO  El tratamiento depender de la gravedad del estreimiento y de la causa. Algunos tratamientos nutricionales son beber ms lquidos y comer ms alimentos ricos en fibra. El cambio en el estilo de vida incluye hacer ejercicios de manera regular. Si estas recomendaciones para realizar cambios en la dieta y en el estilo de vida no ayudan, el mdico le puede indicar el uso de laxantes de venta libre  para ayudarlo a defecar. Los medicamentos recetados se pueden prescribir si los medicamentos de venta libre no lo ayudan.  INSTRUCCIONES PARA EL CUIDADO EN EL HOGAR   Consuma alimentos con alto contenido de fibra, como frutas, vegetales, cereales integrales y porotos.  Limite los alimentos procesados ricos en grasas y azcar, como las papas fritas, hamburguesas, galletas, dulces y refrescos.  Puede agregar un suplemento de fibra a su dieta si no obtiene lo suficiente de los alimentos.  Beba suficiente lquido para mantener la orina clara o de color amarillo plido.  Haga ejercicio regularmente o segn las indicaciones del mdico.  Vaya al bao cuando sienta la necesidad de ir. No se aguante las ganas.  Tome solo medicamentos de venta libre o recetados, segn las indicaciones del mdico. No tome otros medicamentos para el estreimiento sin consultarlo antes con su mdico. SOLICITE ATENCIN MDICA DE INMEDIATO SI:   Observa sangre brillante en las heces.  El estreimiento dura ms de 4 das o empeora.  Siente dolor abdominal o rectal.  Las heces son delgadas como un lpiz.  Pierde peso de manera inexplicable. ASEGRESE DE QUE:   Comprende estas instrucciones.  Controlar su afeccin.  Recibir ayuda de inmediato si no mejora o si empeora.   Esta informacin no tiene como fin reemplazar el consejo del mdico. Asegrese de hacerle al mdico cualquier pregunta que tenga.   Document Released: 07/04/2007 Document Revised: 07/05/2014 Elsevier Interactive Patient Education 2016 Elsevier Inc.  

## 2015-11-27 ENCOUNTER — Emergency Department
Admission: EM | Admit: 2015-11-27 | Discharge: 2015-11-28 | Disposition: A | Discharge status: Home / Self Care | Payer: BLUE CROSS/BLUE SHIELD | Attending: Emergency Medicine | Admitting: Emergency Medicine

## 2015-11-27 ENCOUNTER — Emergency Department: Payer: BLUE CROSS/BLUE SHIELD

## 2015-11-27 ENCOUNTER — Encounter: Payer: Self-pay | Admitting: Emergency Medicine

## 2015-11-27 DIAGNOSIS — Z79899 Other long term (current) drug therapy: Secondary | ICD-10-CM | POA: Insufficient documentation

## 2015-11-27 DIAGNOSIS — Z9889 Other specified postprocedural states: Secondary | ICD-10-CM

## 2015-11-27 DIAGNOSIS — R1030 Lower abdominal pain, unspecified: Secondary | ICD-10-CM | POA: Diagnosis present

## 2015-11-27 DIAGNOSIS — E876 Hypokalemia: Secondary | ICD-10-CM | POA: Diagnosis not present

## 2015-11-27 DIAGNOSIS — K5900 Constipation, unspecified: Secondary | ICD-10-CM | POA: Diagnosis not present

## 2015-11-27 LAB — COMPREHENSIVE METABOLIC PANEL
ALT: 43 U/L (ref 14–54)
AST: 29 U/L (ref 15–41)
Albumin: 4.3 g/dL (ref 3.5–5.0)
Alkaline Phosphatase: 66 U/L (ref 38–126)
Anion gap: 14 (ref 5–15)
BUN: 10 mg/dL (ref 6–20)
CHLORIDE: 101 mmol/L (ref 101–111)
CO2: 22 mmol/L (ref 22–32)
CREATININE: 0.74 mg/dL (ref 0.44–1.00)
Calcium: 9.2 mg/dL (ref 8.9–10.3)
GFR calc Af Amer: >60 mL/min (ref >60)
GFR calc non Af Amer: >60 mL/min (ref >60)
Glucose, Bld: 88 mg/dL (ref 65–99)
Potassium: 3 mmol/L — ABNORMAL LOW (ref 3.5–5.1)
SODIUM: 137 mmol/L (ref 135–145)
Total Bilirubin: 0.8 mg/dL (ref 0.3–1.2)
Total Protein: 7.5 g/dL (ref 6.5–8.1)

## 2015-11-27 LAB — CBC
HCT: 42 % (ref 35.0–47.0)
Hemoglobin: 14.1 g/dL (ref 12.0–16.0)
MCH: 29.7 pg (ref 26.0–34.0)
MCHC: 33.6 g/dL (ref 32.0–36.0)
MCV: 88.4 fL (ref 80.0–100.0)
PLATELETS: 307 10*3/uL (ref 150–440)
RBC: 4.75 MIL/uL (ref 3.80–5.20)
RDW: 13.3 % (ref 11.5–14.5)
WBC: 7.9 10*3/uL (ref 3.6–11.0)

## 2015-11-27 LAB — URINALYSIS COMPLETE WITH MICROSCOPIC (ARMC ONLY)
Bilirubin Urine: NEGATIVE
GLUCOSE, UA: NEGATIVE mg/dL
HGB URINE DIPSTICK: NEGATIVE
Leukocytes, UA: NEGATIVE
Nitrite: NEGATIVE
PROTEIN: NEGATIVE mg/dL
Specific Gravity, Urine: 1.016 (ref 1.005–1.030)
pH: 5 (ref 5.0–8.0)

## 2015-11-27 LAB — LIPASE, BLOOD: LIPASE: 108 U/L — AB (ref 11–51)

## 2015-11-27 LAB — POCT PREGNANCY, URINE: PREG TEST UR: NEGATIVE

## 2015-11-27 MED ORDER — LIDOCAINE VISCOUS 2 % MT SOLN
OROMUCOSAL | Status: AC
  Administered 2015-11-27: 15 mL via OROMUCOSAL
  Filled 2015-11-27: qty 15

## 2015-11-27 MED ORDER — MAGNESIUM CITRATE PO SOLN
1.0000 | Freq: Once | ORAL | Status: AC
  Administered 2015-11-27: 1 via ORAL

## 2015-11-27 MED ORDER — SODIUM CHLORIDE 0.9 % IV BOLUS (SEPSIS)
1000.0000 mL | INTRAVENOUS | Status: AC
Start: 2015-11-27 — End: 2015-11-28
  Administered 2015-11-27: 1000 mL via INTRAVENOUS

## 2015-11-27 MED ORDER — POTASSIUM CHLORIDE CRYS ER 20 MEQ PO TBCR: 20.0000 meq | EXTENDED_RELEASE_TABLET | Freq: Every day | ORAL | Status: AC

## 2015-11-27 MED ORDER — POTASSIUM CHLORIDE CRYS ER 20 MEQ PO TBCR
40.0000 meq | EXTENDED_RELEASE_TABLET | Freq: Once | ORAL | Status: AC
  Administered 2015-11-27: 40 meq via ORAL

## 2015-11-27 MED ORDER — POTASSIUM CHLORIDE CRYS ER 20 MEQ PO TBCR
EXTENDED_RELEASE_TABLET | ORAL | Status: AC
  Filled 2015-11-27: qty 2

## 2015-11-27 MED ORDER — LIDOCAINE VISCOUS 2 % MT SOLN
15.0000 mL | Freq: Once | OROMUCOSAL | Status: AC
  Administered 2015-11-27: 15 mL via OROMUCOSAL
  Filled 2015-11-27: qty 15

## 2015-11-27 MED ORDER — IOPAMIDOL (ISOVUE-300) INJECTION 61%
100.0000 mL | Freq: Once | INTRAVENOUS | Status: AC | PRN
  Administered 2015-11-27: 100 mL via INTRAVENOUS
  Filled 2015-11-27: qty 100

## 2015-11-27 MED ORDER — MAGNESIUM CITRATE PO SOLN
ORAL | Status: AC
  Filled 2015-11-27: qty 296

## 2015-11-27 MED ORDER — DIATRIZOATE MEGLUMINE & SODIUM 66-10 % PO SOLN
15.0000 mL | Freq: Once | ORAL | Status: AC
  Administered 2015-11-27: 15 mL via ORAL

## 2015-11-27 NOTE — ED Notes (Signed)
C/O lower abdomianl pain onset of symptoms last night.  States last normal BM was last Friday.  Patient had Gastric Sleeve surgery 5/8.  Patient also has Hemorids.

## 2015-11-27 NOTE — ED Notes (Signed)
PT FINISHED DRINKING CONTRAST. CT NOTIFIED. 

## 2015-11-27 NOTE — ED Notes (Signed)
Patient states she was here on Monday for the same problem. States she feels a large piece of stool at rectum but cannot get it out.

## 2015-11-27 NOTE — ED Provider Notes (Addendum)
[facility] Emergency Department Provider Note  ____________________________________________  Time seen: Approximately 8:40 PM  I have reviewed the triage vital signs and the nursing notes.   HISTORY  Chief Complaint Abdominal Pain  The patient and/or family speak(s) Spanish.  They understand they have the right to the use of a hospital interpreter, however at this time they prefer to speak directly with me in Spanish.  They know that they can ask for an interpreter at any time.   HPI Elizabeth Mcdowell is a 35 y.o. female with a history of gastric sleeve surgery by Dr. Smitty Cords about one month ago who presents for her second emergency department visit for constipation.  She reports that she is having pain in her lower abdomen and has not been able to have a normal bowel movement.  She was told to expect some constipation particularly after switching from.  Liquids to shakes after her recent surgery, but in spite of taking multiple oral laxatives and stool softeners as well as using glycerin suppositories, she still has not been able to have a bowel movement.  She reports that after coming to the emergency department the last time when they did an enema with minimal success, she is to have fleets enemas at home.  She has been taking MiraLAX.  She will insert her own finger rectally and she can feel a large stool ball which she will push to the side and then liquid will come out, but she cannot get the stool out.  She reports the symptoms are severe, the abdominal cramping in her lower abdomen is moderate,and nothing is making it better and it is getting worse over time.  Denies fever/chills, chest pain, shortness of breath, nausea, vomiting, dysuria.   Past Medical History  Diagnosis Date  . Obesity affecting pregnancy 2016  . Anxiety   . GERD (gastroesophageal reflux disease)     Patient Active Problem List   Diagnosis Date Noted  . Supervision of high-risk  pregnancy 04/24/2015  . H/O cesarean section 04/24/2015  . 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 Obesity 04/24/2015  . BMI 30.0-30.9,adult 04/24/2015  . S/P cesarean section 04/24/2015  . Fetal renal anomaly 03/27/2015    Past Surgical History  Procedure Laterality Date  . Cesarean section    . Cyst on ovary    . Appendectomy    . Cesarean section with bilateral tubal ligation N/A 04/24/2015    Procedure: CESAREAN SECTION WITH BILATERAL TUBAL LIGATION;  Surgeon: Conard Novak, MD;  Location: [facility] ORS;  Service: Obstetrics;  Laterality: N/A;  . Laparoscopic gastric sleeve resection      Current Outpatient Rx  Name  Route  Sig  Dispense  Refill  . calcium carbonate (TUMS EX) 750 MG chewable tablet   Oral   Chew 2 tablets by mouth 2 (two) times daily.         . magnesium citrate SOLN   Oral   Take 296 mLs (1 Bottle total) by mouth once as needed for severe constipation.   195 mL   0   . oxyCODONE-acetaminophen (PERCOCET) 5-325 MG tablet   Oral   Take 1 tablet by mouth every 4 (four) hours as needed for moderate pain or severe pain.   40 tablet   0   . polyethylene glycol (MIRALAX) packet   Oral   Take 17 g by mouth daily as needed for mild constipation or moderate constipation.   14 each   0   .  potassium chloride SA (KLOR-CON M20) 20 MEQ tablet   Oral   Take 1 tablet (20 mEq total) by mouth daily.   14 tablet   0   . Prenatal Vit-Fe Fumarate-FA (MULTIVITAMIN-PRENATAL) 27-0.8 MG TABS tablet   Oral   Take 1 tablet by mouth daily at 12 noon.         . ranitidine (ZANTAC) 150 MG tablet   Oral   Take 150 mg by mouth 2 (two) times daily.           Allergies Latex  No family history on file.  Social History Social History  Substance Use Topics  . Smoking status: Never Smoker   . Smokeless tobacco: Never Used  . Alcohol Use: No    Review of Systems Constitutional: No fever/chills Eyes: No visual changes. ENT: No sore throat. Cardiovascular: Denies  chest pain. Respiratory: Denies shortness of breath. Gastrointestinal: Lower abdominal pain and constipation with a palpable stool ball in the rectum. Genitourinary: Negative for dysuria. Musculoskeletal: Negative for back pain. Skin: Negative for rash. Neurological: Negative for headaches, focal weakness or numbness.  10-point ROS otherwise negative.  ____________________________________________   PHYSICAL EXAM:  VITAL SIGNS: ED Triage Vitals  Enc Vitals Group     BP 11/27/15 1918 121/70 mmHg     Pulse Rate 11/27/15 1918 80     Resp 11/27/15 1918 16     Temp 11/27/15 1918 98.3 F (36.8 C)     Temp Source 11/27/15 1918 Oral     SpO2 11/27/15 1918 97 %     Weight 11/27/15 1918 206 lb (93.441 kg)     Height 11/27/15 1918  (1.549 m)     Head Cir --      Peak Flow --      Pain Score 11/27/15 1918 10     Pain Loc --      Pain Edu? --      Excl. in GC? --     Constitutional: Alert and oriented. Well appearing and in no acute distress. Eyes: Conjunctivae are normal. PERRL. EOMI. Head: Atraumatic. Nose: No congestion/rhinnorhea. Mouth/Throat: Mucous membranes are moist.  Oropharynx non-erythematous. Neck: No stridor.  No meningeal signs.   Cardiovascular: Normal rate, regular rhythm. Good peripheral circulation. Grossly normal heart sounds.   Respiratory: Normal respiratory effort.  No retractions. Lungs CTAB. Gastrointestinal: Soft and nontender. No distention.  Rectal:  Non-thrombosed external hemorrhoid.  Tender on digital exam.  Large firm stool ball just beyond reach - able to touch it and partially broke it in pieces, but cannot extract it even when the patient bears down as if having a bowel movement. Musculoskeletal: No lower extremity tenderness nor edema. No gross deformities of extremities. Neurologic:  Normal speech and language. No gross focal neurologic deficits are appreciated.  Skin:  Skin is warm, dry and intact. No rash noted. Psychiatric: Mood and  affect are normal. Speech and behavior are normal.  ____________________________________________   LABS (all labs ordered are listed, but only abnormal results are displayed)  Labs Reviewed  LIPASE, BLOOD - Abnormal; Notable for the following:    Lipase 108 (*)    All other components within normal limits  COMPREHENSIVE METABOLIC PANEL - Abnormal; Notable for the following:    Potassium 3.0 (*)    All other components within normal limits  URINALYSIS COMPLETEWITH MICROSCOPIC ([facility] ONLY) - Abnormal; Notable for the following:    Color, Urine YELLOW (*)    APPearance HAZY (*)    Ketones,  ur 2+ (*)    Bacteria, UA MANY (*)    Squamous Epithelial / LPF 0-5 (*)    All other components within normal limits  CBC  POC URINE PREG, ED  POCT PREGNANCY, URINE   ____________________________________________  EKG  None ____________________________________________  RADIOLOGY   Ct Abdomen Pelvis W Contrast  11/27/2015  CLINICAL DATA:  Acute onset of lower abdominal pain. Initial encounter. EXAM: CT ABDOMEN AND PELVIS WITH CONTRAST TECHNIQUE: Multidetector CT imaging of the abdomen and pelvis was performed using the standard protocol following bolus administration of intravenous contrast. CONTRAST:  100mL ISOVUE-300 IOPAMIDOL (ISOVUE-300) INJECTION 61% COMPARISON:  Pelvic ultrasound performed 03/27/2015 FINDINGS: The visualized lung bases are clear. The patient is status post sleeve gastrectomy. The liver and spleen are unremarkable in appearance. The gallbladder is within normal limits. The pancreas and adrenal glands are unremarkable. The kidneys are unremarkable in appearance. There is no evidence of hydronephrosis. No renal or ureteral stones are seen. No perinephric stranding is appreciated. No free fluid is identified. The small bowel is unremarkable in appearance. The stomach is within normal limits. No acute vascular abnormalities are seen. The appendix is normal in caliber, without  evidence of appendicitis. The colon is grossly unremarkable in appearance. The bladder is mildly distended and grossly unremarkable. The uterus is unremarkable in appearance. The ovaries are relatively symmetric. No suspicious adnexal masses are seen. No inguinal lymphadenopathy is seen. Postoperative change is noted along the anterior pelvic wall. No acute osseous abnormalities are identified. IMPRESSION: No acute abnormality seen within the abdomen or pelvis. Electronically Signed   By: Roanna RaiderJeffery  Chang M.D.   On: 11/27/2015 23:07    ____________________________________________   PROCEDURES  Procedure(s) performed: fecal disimpaction, see procedure note(s).  ------------------------------------------------------------------------------------------------------------------- Fecal Disimpaction Procedure Note:  Performed by me:  Patient placed in the lateral recumbent position with knees drawn towards chest. Nurse present for patient support. Small amount of hard brown stool removed, could not remove large stool ball. No complications during procedure.   ------------------------------------------------------------------------------------------------------------------    Critical Care performed: No ____________________________________________   INITIAL IMPRESSION / ASSESSMENT AND PLAN / ED COURSE  Pertinent labs & imaging results that were available during my care of the patient were reviewed by me and considered in my medical decision making (see chart for details).  I can feel a fecal ball but could not completely extracted it manually.  I was able to break it up into some pieces of Dilaudid few of the small pieces.  I obtained a bariatric protocol CT scan and it was unremarkable.  We will try another "pink elephant" enema as well as some additional oral stool softener but I encouraged her to follow up with her bariatric surgeon.  There is no other acute intervention at this time is  indicated.  She understands and agrees and states she feels little bit better than she did before.  Also notable is her hypokalemia which I fully believe is contributing to her constipation.  I gave her a dose of 40 mEq by mouth potassium here and a prescription for 2 weeks' worth of supplements, 20 mEq per day.  Of note, the patient's lipase is elevated.  However, she has no upper abdominal / epigastric tenderness, no N/V, and an unremarkable CT scan.  I will not further investigate it since it does not appear to be clinically relevant. ____________________________________________  FINAL CLINICAL IMPRESSION(S) / ED DIAGNOSES  Final diagnoses:  Constipation, unspecified constipation type  Hypokalemia  History of gastric surgery  MEDICATIONS GIVEN DURING THIS VISIT:  Medications  sodium chloride 0.9 % bolus 1,000 mL (not administered)  magnesium citrate solution 1 Bottle (not administered)  potassium chloride SA (K-DUR,KLOR-CON) CR tablet 40 mEq (not administered)  diatrizoate meglumine-sodium (GASTROGRAFIN) 66-10 % solution 15 mL (15 mLs Oral Given 11/27/15 2122)  lidocaine (XYLOCAINE) 2 % viscous mouth solution 15 mL (15 mLs Mouth/Throat Given 11/27/15 2125)  iopamidol (ISOVUE-300) 61 % injection 100 mL (100 mLs Intravenous Contrast Given 11/27/15 2231)     NEW OUTPATIENT MEDICATIONS STARTED DURING THIS VISIT:  New Prescriptions   POTASSIUM CHLORIDE SA (KLOR-CON M20) 20 MEQ TABLET    Take 1 tablet (20 mEq total) by mouth daily.      Note:  This document was prepared using Dragon voice recognition software and may include unintentional dictation errors.   Loleta Rose, MD 11/27/15 1610  Loleta Rose, MD 11/28/15 [phone]

## 2015-11-27 NOTE — Discharge Instructions (Signed)
You were seen in the emergency department today for constipation.  We recommend that you use one or more of the following over-the-counter medications in the order described:   1)  Colace (or Dulcolax) 100 mg:  This is a stool softener, and you may take it once or twice a day as needed. 2)  Senna tablets:  This is a bowel stimulant that will help "push" out your stool. It is the next step to add after you have tried a stool softener. 3)  Miralax (powder):  This medication works by drawing additional fluid into your intestines and helps to flush out your stool.  Mix the powder with water or juice according to label instructions.  It may help if the Colace and Senna are not sufficient, but you must be sure to use the recommended amount of water or juice when you mix up the powder. Remember that narcotic pain medications are constipating, so avoid them or minimize their use.  Drink plenty of fluids.  Additionally, your potassium level is low, which can also cause or contribute to constipation.  Please take the prescribed potassium supplements as prescribed.  Please return to the Emergency Department immediately if you develop new or worsening symptoms that concern you, such as (but not limited to) fever > 101 degrees, severe abdominal pain, or persistent vomiting.    Estreimiento - Adultos (Constipation, Adult) Estreimiento significa que una persona tiene menos de tres evacuaciones en una semana, dificultad para defecar, o que las heces son secas, duras, o ms grandes que lo normal. A medida que envejecemos el estreimiento es ms comn. Una dieta baja en fibra, no tomar suficientes lquidos y el uso de ciertos medicamentos pueden Scientist, research (life sciences).  CAUSAS   Ciertos medicamentos, como los antidepresivos, analgsicos, suplementos de hierro, anticidos y diurticos.  Algunas enfermedades, como la diabetes, el sndrome del colon irritable, enfermedad de la tiroides, o depresin.  No beber  suficiente agua.  No consumir suficientes alimentos ricos en fibra.  Situaciones de estrs o viajes.  Falta de actividad fsica o de ejercicio.  Ignorar la necesidad sbita de Advertising copywriter.  Uso en exceso de laxantes. SIGNOS Y SNTOMAS   Defecar menos de tres veces por semana.  Dificultad para defecar.  Tener las heces secas y duras, o ms grandes que las normales.  Sensacin de estar lleno o hinchado.  Dolor en la parte baja del abdomen.  No sentir alivio despus de defecar. DIAGNSTICO  El mdico le har una historia clnica y un examen fsico. Pueden hacerle exmenes adicionales para el estreimiento grave. Estos estudios pueden ser:  Un radiografa con enema de bario para examinar el recto, el colon y, en algunos casos, el intestino delgado.  Una sigmoidoscopia para examinar el colon inferior.  Una colonoscopia para examinar todo el colon. TRATAMIENTO  El tratamiento depender de la gravedad del estreimiento y de la causa. Algunos tratamientos nutricionales son beber ms lquidos y comer ms alimentos ricos en fibra. El cambio en el estilo de vida incluye hacer ejercicios de [place] regular. Si estas recomendaciones para Public relations account executive dieta y en el estilo de vida no ayudan, el mdico le puede indicar el uso de laxantes de venta libre para ayudarlo a Advertising copywriter. Los medicamentos recetados se pueden prescribir si los medicamentos de venta libre no lo [place].  INSTRUCCIONES PARA EL CUIDADO EN EL HOGAR   Consuma alimentos con alto contenido de [place], como frutas, vegetales, cereales integrales y porotos.  Limite los alimentos procesados ricos en  grasas y azcar, como las papas fritas, hamburguesas, Iberiagalletas, dulces y refrescos.  Puede agregar un suplemento de fibra a su dieta si no obtiene lo suficiente de los alimentos.  Beba suficiente lquido para Photographermantener la orina clara o de color amarillo plido.  Haga ejercicio regularmente o segn las indicaciones del  mdico.  Vaya al bao cuando sienta la necesidad de ir. No se aguante las ganas.  Tome solo medicamentos de venta libre o recetados, segn las indicaciones del mdico. No tome otros medicamentos para el estreimiento sin consultarlo antes con su mdico. SOLICITE ATENCIN MDICA DE INMEDIATO SI:   Observa sangre brillante en las heces.  El estreimiento dura ms de 4 das o Brandermillempeora.  Siente dolor abdominal o rectal.  Las heces son delgadas como un lpiz.  Pierde peso de [place] inexplicable. ASEGRESE DE QUE:   Comprende estas instrucciones.  Controlar su afeccin.  Recibir ayuda de inmediato si no mejora o si empeora.   Esta informacin no tiene Theme park managercomo fin reemplazar el consejo del mdico. Asegrese de hacerle al mdico cualquier pregunta que tenga.   Document Released: 07/04/2007 Document Revised: 07/05/2014 Elsevier Interactive Patient Education [employer]2016 Elsevier Inc.

## 2015-11-28 NOTE — ED Notes (Signed)
Patient tolerated enema well. Husband at bedside. Bedside commode set up for patient.

## 2015-11-28 NOTE — ED Notes (Signed)
Patient having some success with bowel movement. Will continue to monitor.

## 2016-10-15 IMAGING — US US MFM OB DETAIL+14 WK
1 series · 14 of 28 positions shown · non-contrast
Comparison: none

[Series 1: us mfm ob detail+14 wk · 0.26mm/px · 14 of 51 slices shown]
[im 2/51]
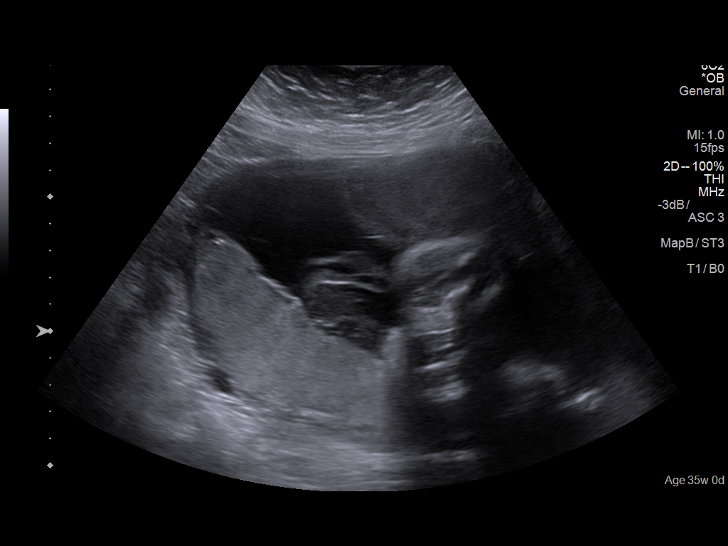
[im 6/51]
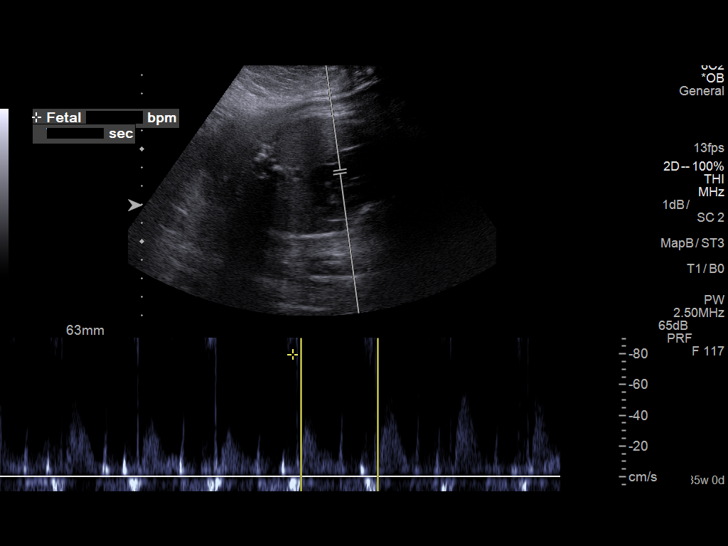
[im 10/51]
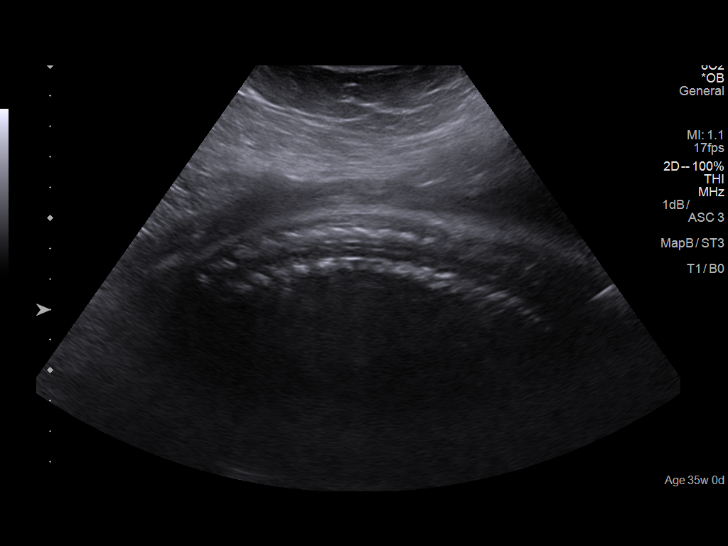
[im 13/51]
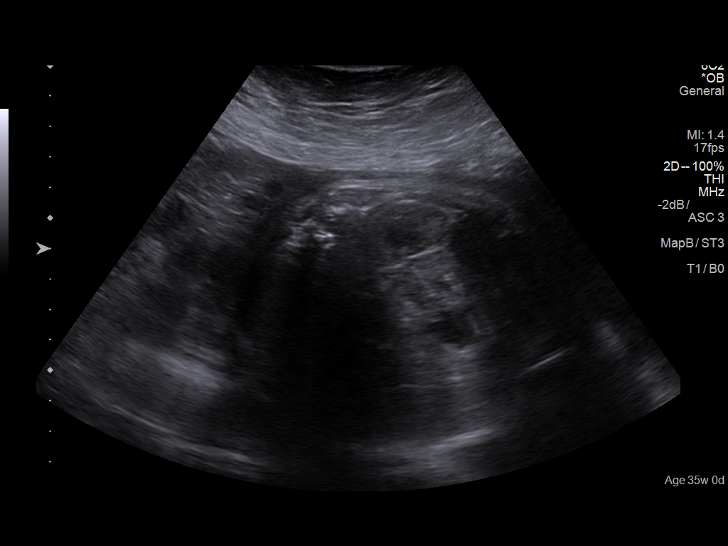
[im 17/51]
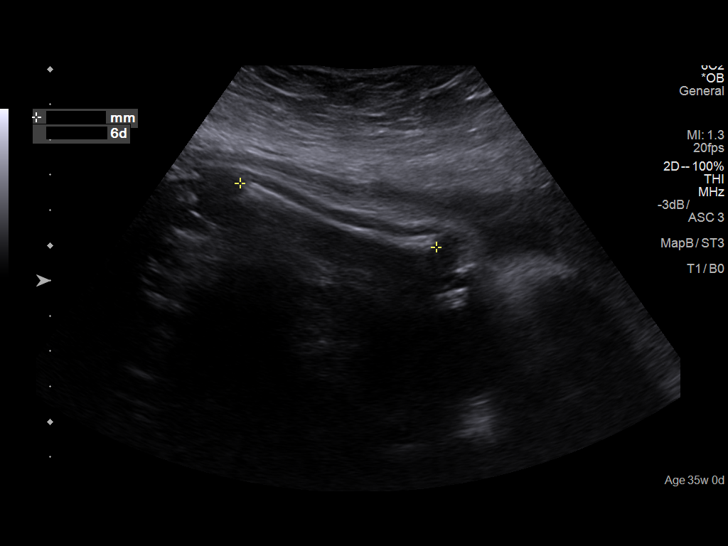
[im 21/51]
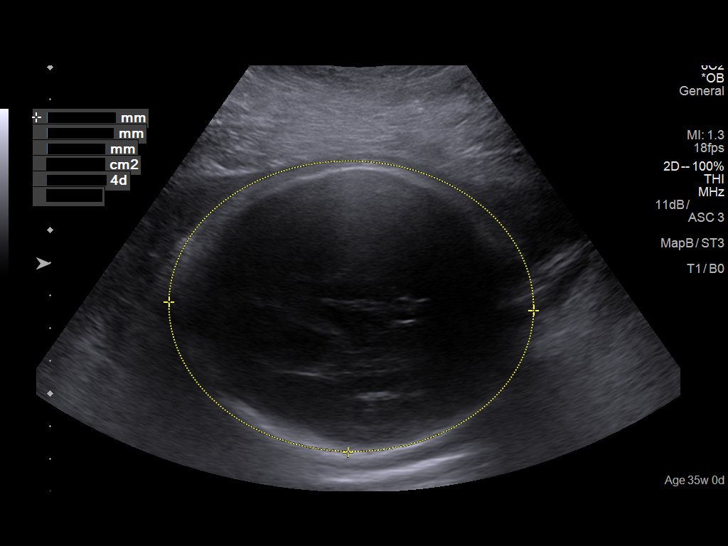
[im 25/51]
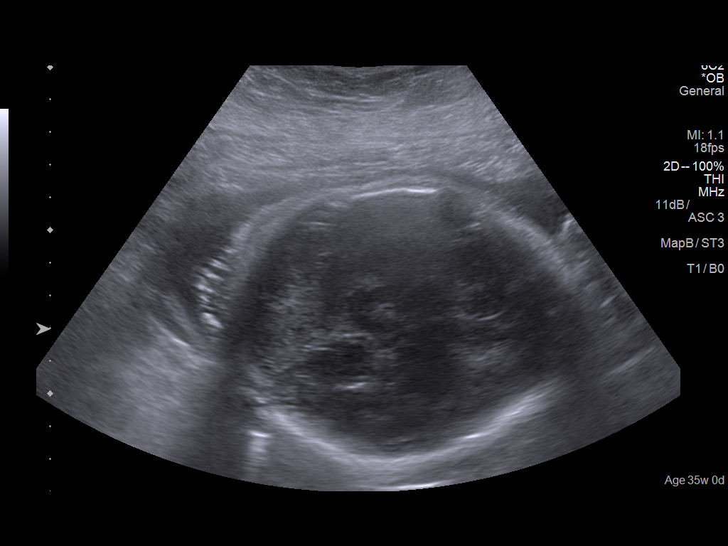
[im 28/51]
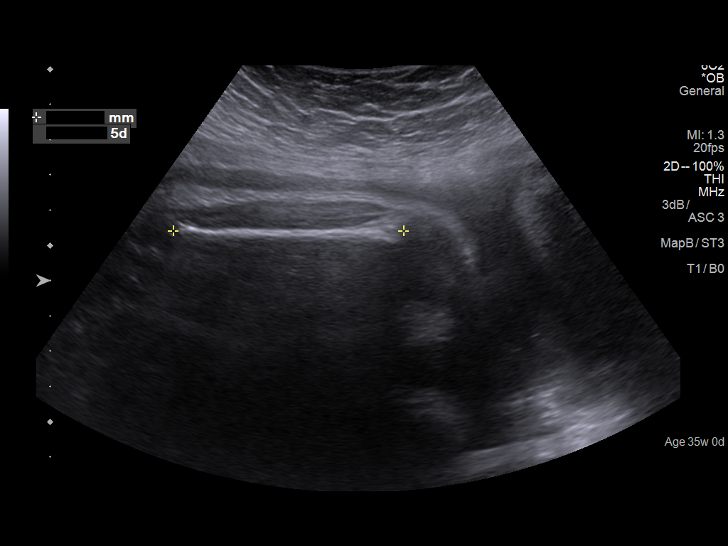
[im 32/51]
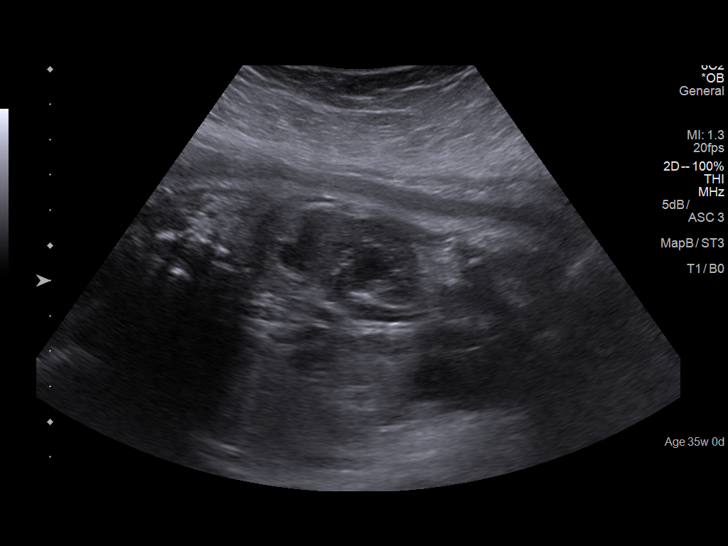
[im 36/51]
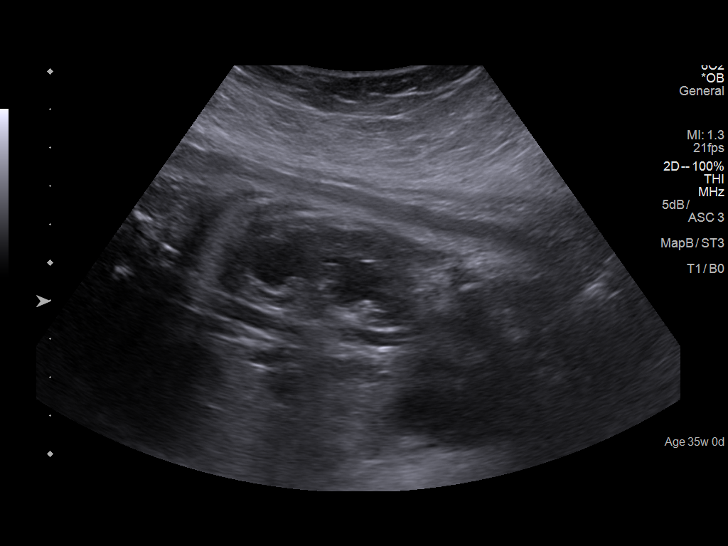
[im 39/51]
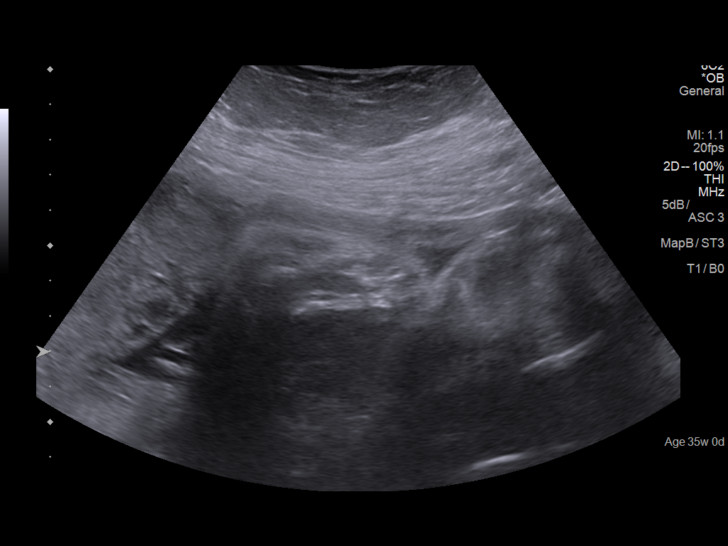
[im 43/51]
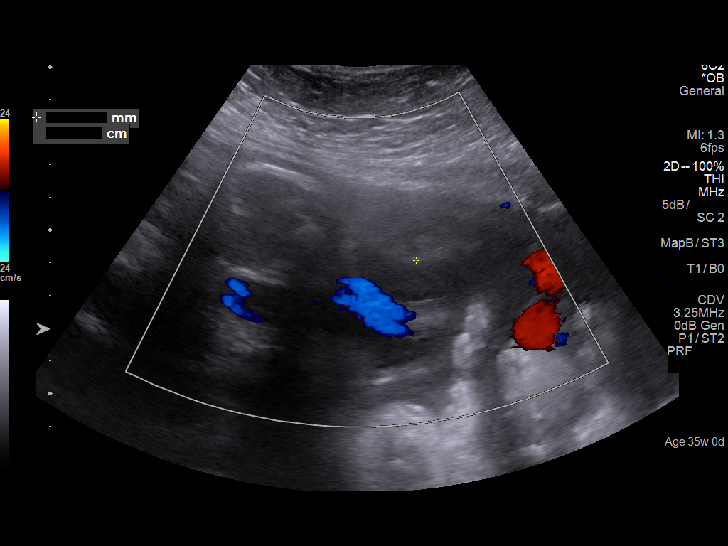
[im 47/51]
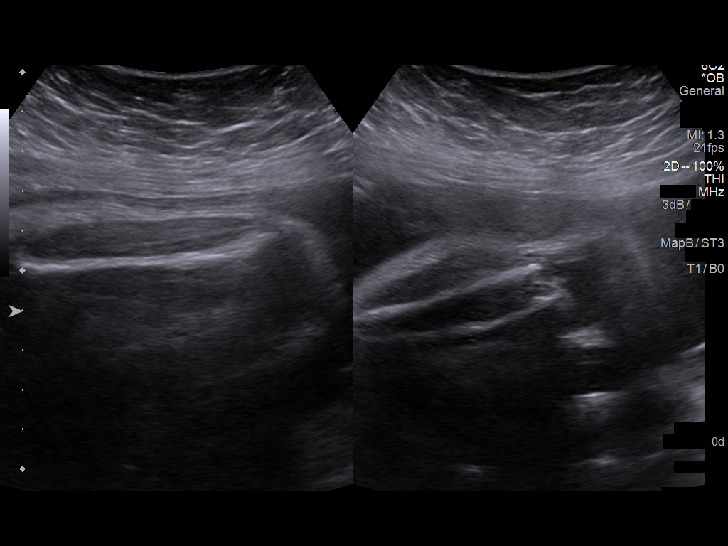
[im 51/51]
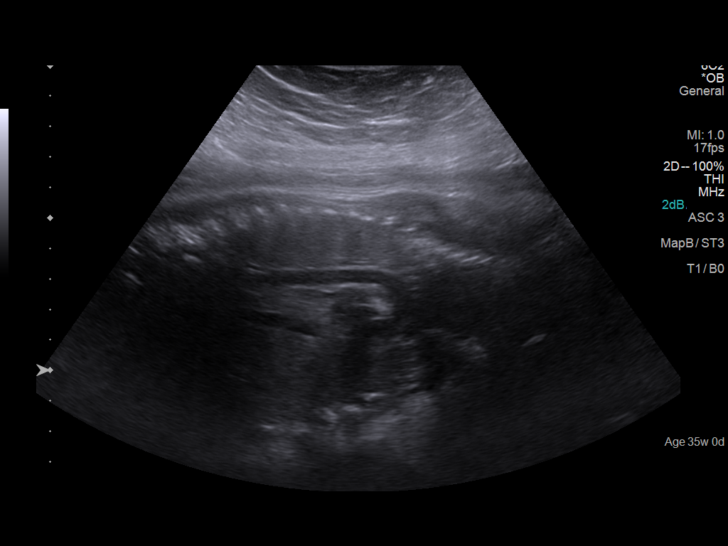

[14 of 28 positions shown; findings below may reference images not displayed]

Canned report from images found in remote index.

Refer to host system for actual result text.

## 2017-06-28 HISTORY — PX: AUGMENTATION MAMMAPLASTY: SUR837

## 2017-06-28 HISTORY — PX: REDUCTION MAMMAPLASTY: SUR839

## 2017-09-29 IMAGING — CT CT ABD-PELV W/ CM
2 of 5 series · 16 of 46 positions shown, 18 images · IV contrast (iopamidol)
Comparison: Pelvic ultrasound performed 03/27/2015

CLINICAL DATA: Acute onset of lower abdominal pain. Initial
encounter.

EXAM:
CT ABDOMEN AND PELVIS WITH CONTRAST
TECHNIQUE: Multidetector CT imaging of the abdomen and pelvis was performed
using the standard protocol following bolus administration of
intravenous contrast.
CONTRAST:  100mL EKL1Y8-WTT IOPAMIDOL (EKL1Y8-WTT) INJECTION 61%

[Series 2: routine abd pel with · axial · 0.81mm/px · z∈[-982,-557]mm · 13 of 96 slices shown, 15 images]
[im 6/96  soft-tissue]
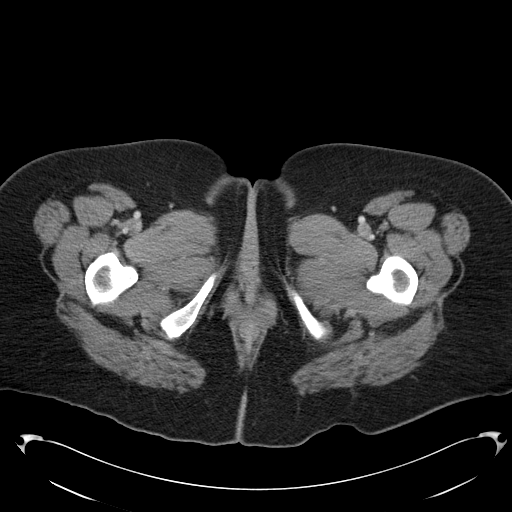
[im 6/96  bone]
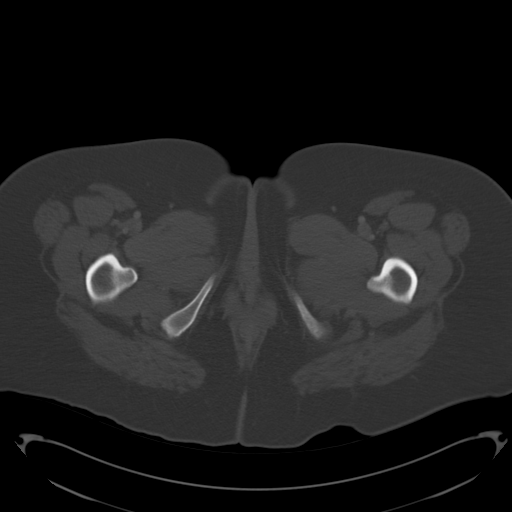
[im 16/96  soft-tissue]
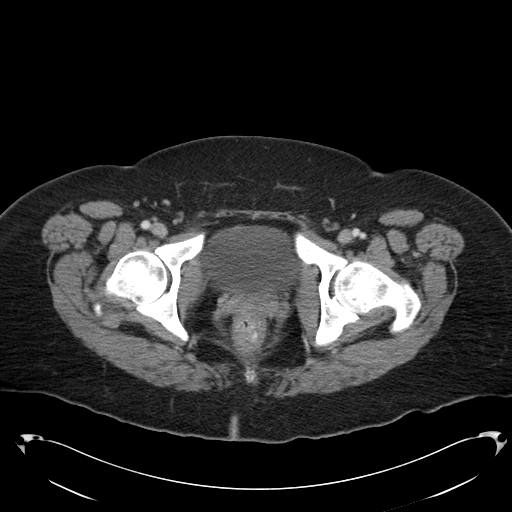
[im 21/96  soft-tissue]
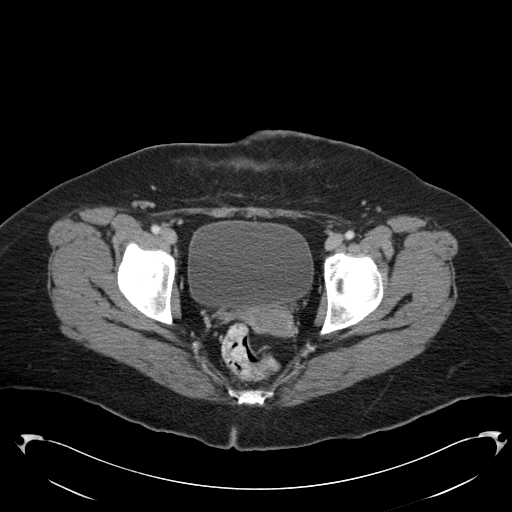
[im 26/96  soft-tissue]
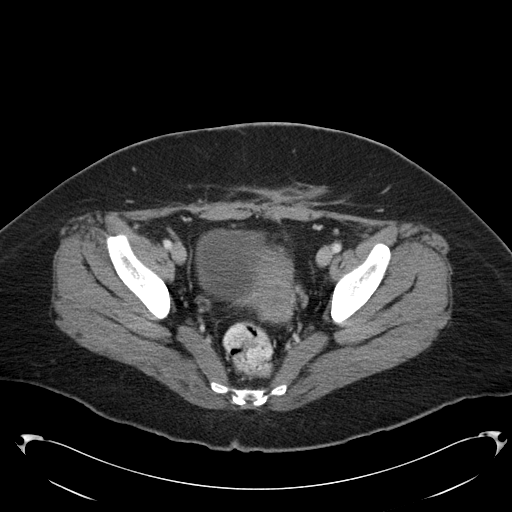
[im 36/96  soft-tissue]
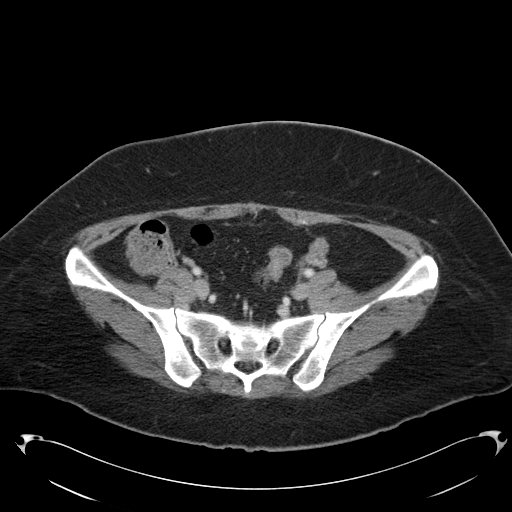
[im 41/96  soft-tissue]
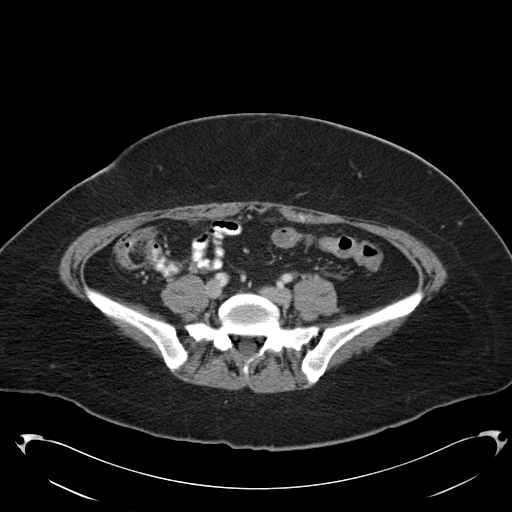
[im 51/96  soft-tissue]
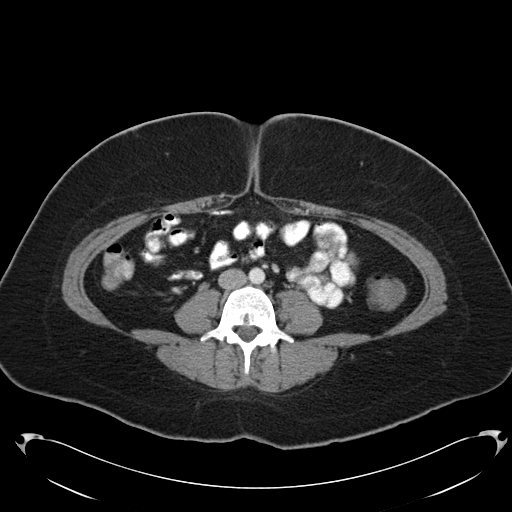
[im 56/96  soft-tissue]
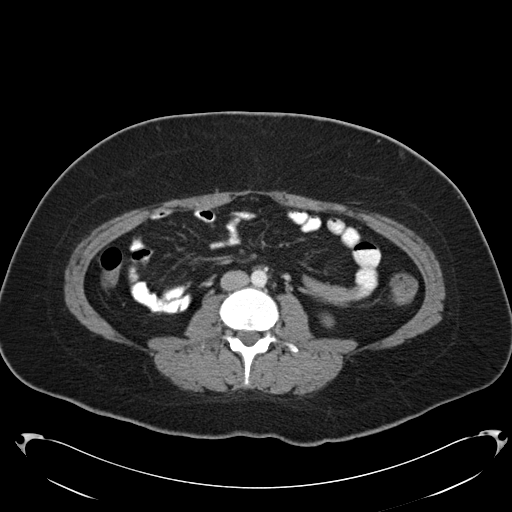
[im 61/96  soft-tissue]
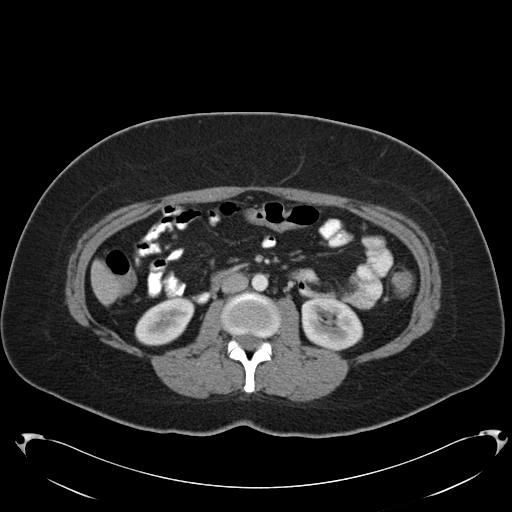
[im 61/96  bone]
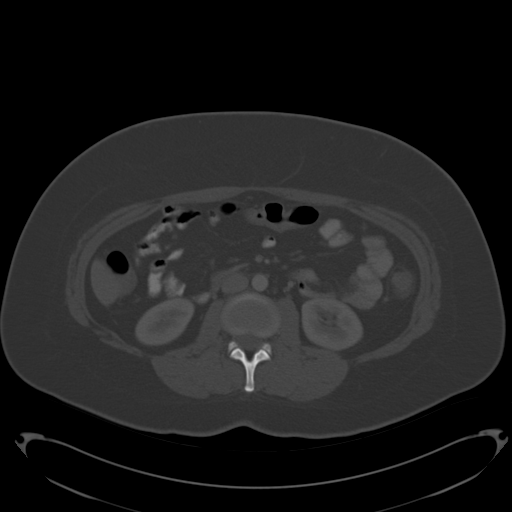
[im 71/96  soft-tissue]
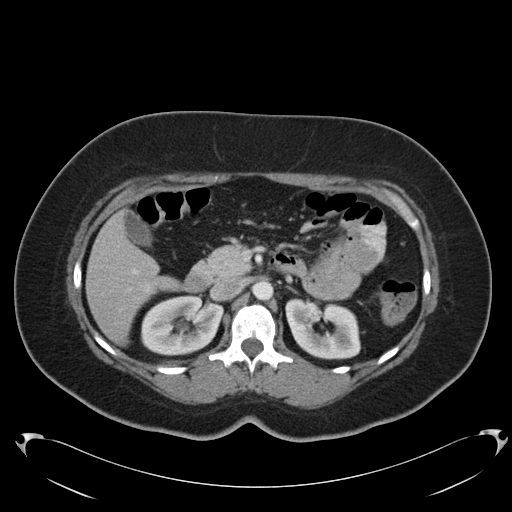
[im 76/96  soft-tissue]
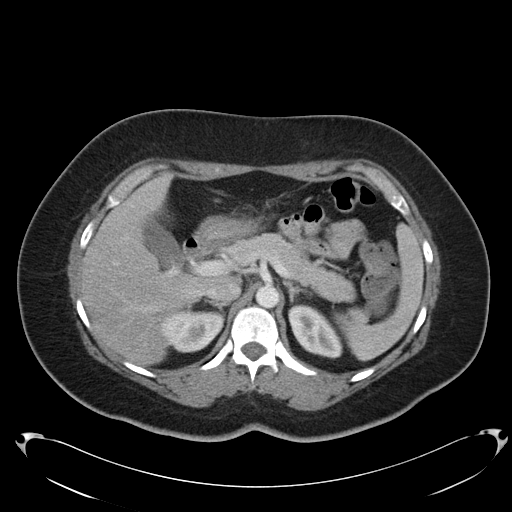
[im 81/96  soft-tissue]
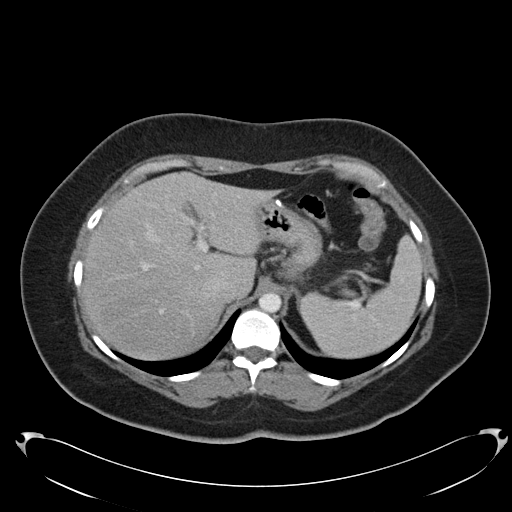
[im 91/96  soft-tissue]
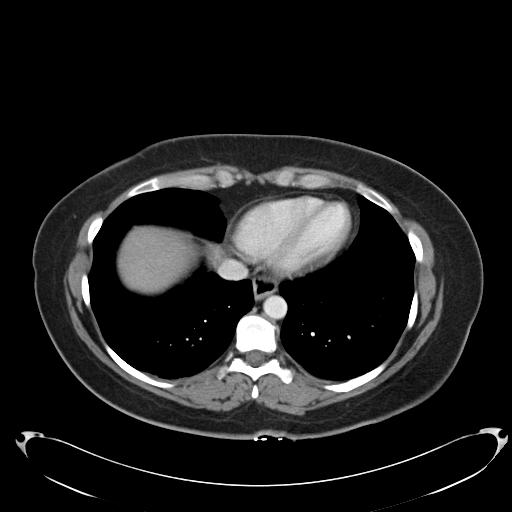

[Series 5: cor routine abd pel with · coronal · 0.77mm/px · 3 of 143 slices shown]
[im 48/143  soft-tissue]
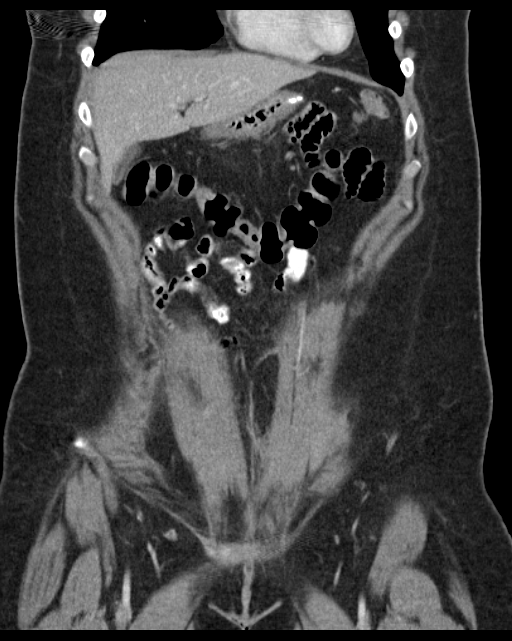
[im 64/143  soft-tissue]
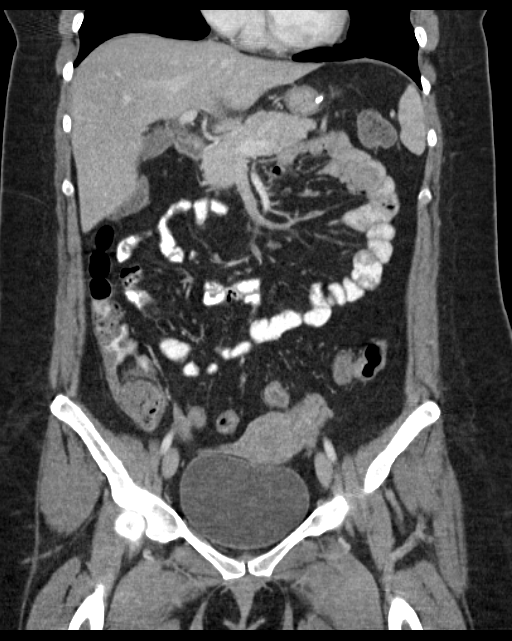
[im 79/143  soft-tissue]
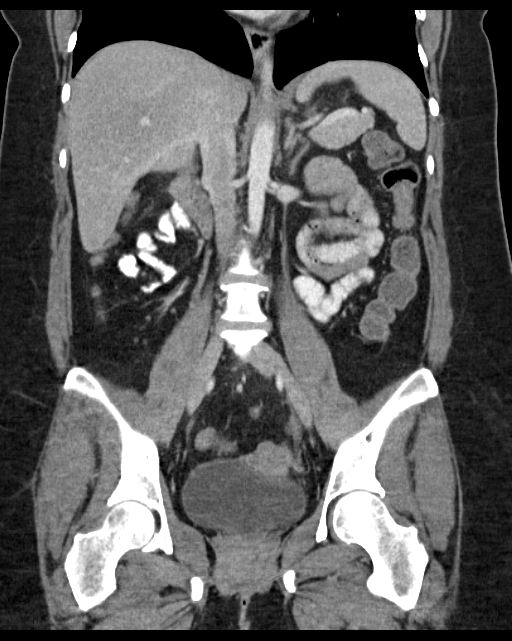

[16 of 46 positions shown; findings below may reference images not displayed]

FINDINGS: The visualized lung bases are clear. The patient is status post
sleeve gastrectomy.

The liver and spleen are unremarkable in appearance. The gallbladder
is within normal limits. The pancreas and adrenal glands are
unremarkable.

The kidneys are unremarkable in appearance. There is no evidence of
hydronephrosis. No renal or ureteral stones are seen. No perinephric
stranding is appreciated.

No free fluid is identified. The small bowel is unremarkable in
appearance. The stomach is within normal limits. No acute vascular
abnormalities are seen.

The appendix is normal in caliber, without evidence of appendicitis.
The colon is grossly unremarkable in appearance.

The bladder is mildly distended and grossly unremarkable. The uterus
is unremarkable in appearance. The ovaries are relatively symmetric.
No suspicious adnexal masses are seen. No inguinal lymphadenopathy
is seen. Postoperative change is noted along the anterior pelvic
wall.

No acute osseous abnormalities are identified.
IMPRESSION: No acute abnormality seen within the abdomen or pelvis.

## 2021-06-28 HISTORY — PX: BREAST BIOPSY: SHX20

## 2022-02-22 ENCOUNTER — Ambulatory Visit (LOCAL_COMMUNITY_HEALTH_CENTER): Payer: Self-pay

## 2022-02-22 DIAGNOSIS — Z111 Encounter for screening for respiratory tuberculosis: Secondary | ICD-10-CM

## 2022-02-22 NOTE — Progress Notes (Signed)
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 Per pt, had "TB blood test" in June 2023 which was negative. No documentation with her and says her job is requesting ppd. 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  RN counseled that ppd may be positive since hx BCG. RN offered pt option to have QFT with cost of $94. Pt declines QFT and prefers to have ppd today. PPD placed. Has PPDR appt scheduled 02/25/2022. Jerel Shepherd, RN

## 2022-02-25 ENCOUNTER — Ambulatory Visit (LOCAL_COMMUNITY_HEALTH_CENTER): Payer: Self-pay

## 2022-02-25 DIAGNOSIS — Z111 Encounter for screening for respiratory tuberculosis: Secondary | ICD-10-CM

## 2022-02-25 LAB — TB SKIN TEST
Induration: 0 mm
TB Skin Test: NEGATIVE

## 2022-02-25 NOTE — Progress Notes (Signed)
In nurse clinic for PPDR which was read as 0 mm (Negative.) Slight bruising noted at ppd site, but no induration. Jerel Shepherd, RN

## 2022-06-17 ENCOUNTER — Other Ambulatory Visit: Payer: Self-pay | Admitting: Nurse Practitioner

## 2022-06-17 ENCOUNTER — Encounter: Payer: Self-pay | Admitting: Nurse Practitioner

## 2022-06-17 DIAGNOSIS — N83201 Unspecified ovarian cyst, right side: Secondary | ICD-10-CM

## 2022-06-30 ENCOUNTER — Other Ambulatory Visit: Payer: Self-pay | Admitting: Nurse Practitioner

## 2022-06-30 DIAGNOSIS — Z1231 Encounter for screening mammogram for malignant neoplasm of breast: Secondary | ICD-10-CM

## 2022-07-09 ENCOUNTER — Other Ambulatory Visit: Payer: Self-pay | Admitting: Nurse Practitioner

## 2022-07-09 ENCOUNTER — Ambulatory Visit
Admission: RE | Admit: 2022-07-09 | Discharge: 2022-07-09 | Disposition: A | Discharge status: Home / Self Care | Payer: BC Managed Care – PPO | Source: Ambulatory Visit | Attending: Nurse Practitioner | Admitting: Nurse Practitioner

## 2022-07-09 DIAGNOSIS — Z1231 Encounter for screening mammogram for malignant neoplasm of breast: Secondary | ICD-10-CM | POA: Insufficient documentation

## 2022-07-16 ENCOUNTER — Inpatient Hospital Stay
Admission: RE | Admit: 2022-07-16 | Discharge: 2022-07-16 | Disposition: A | Discharge status: Home / Self Care | Payer: Self-pay | Source: Ambulatory Visit | Attending: *Deleted | Admitting: *Deleted

## 2022-07-16 ENCOUNTER — Other Ambulatory Visit: Payer: Self-pay | Admitting: *Deleted

## 2022-07-16 DIAGNOSIS — Z1231 Encounter for screening mammogram for malignant neoplasm of breast: Secondary | ICD-10-CM

## 2022-10-26 ENCOUNTER — Ambulatory Visit: Payer: BC Managed Care – PPO | Admitting: Gastroenterology
